# Patient Record
Sex: Female | Born: 1995 | Race: Black or African American | Hispanic: No | Marital: Single | Smoking: Never smoker
Health system: Southern US, Community
[De-identification: ages and names within clinical notes are randomized; demographics above are authoritative.]

## PROBLEM LIST (undated history)

## (undated) ENCOUNTER — Inpatient Hospital Stay (HOSPITAL_COMMUNITY): Payer: Self-pay

## (undated) DIAGNOSIS — F329 Major depressive disorder, single episode, unspecified: Secondary | ICD-10-CM

## (undated) DIAGNOSIS — F419 Anxiety disorder, unspecified: Secondary | ICD-10-CM

## (undated) DIAGNOSIS — F32A Depression, unspecified: Secondary | ICD-10-CM

## (undated) DIAGNOSIS — F4310 Post-traumatic stress disorder, unspecified: Secondary | ICD-10-CM

## (undated) HISTORY — PX: HERNIA REPAIR: SHX51

---
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
## 2017-05-07 LAB — CYTOLOGY - PAP: Pap: ABNORMAL — AB

## 2017-05-21 ENCOUNTER — Emergency Department (HOSPITAL_COMMUNITY)
Admission: EM | Admit: 2017-05-21 | Discharge: 2017-05-22 | Disposition: A | Discharge status: Other Acute Inpatient Hospital | Payer: Self-pay | Attending: Emergency Medicine | Admitting: Emergency Medicine

## 2017-05-21 ENCOUNTER — Encounter (HOSPITAL_COMMUNITY): Payer: Self-pay | Admitting: Nurse Practitioner

## 2017-05-21 DIAGNOSIS — T50902A Poisoning by unspecified drugs, medicaments and biological substances, intentional self-harm, initial encounter: Secondary | ICD-10-CM

## 2017-05-21 DIAGNOSIS — T391X2A Poisoning by 4-Aminophenol derivatives, intentional self-harm, initial encounter: Secondary | ICD-10-CM | POA: Insufficient documentation

## 2017-05-21 LAB — PROTIME-INR
INR: 1.11
Prothrombin Time: 14.3 seconds (ref 11.4–15.2)

## 2017-05-21 LAB — CBC
HCT: 37.7 % (ref 36.0–46.0)
Hemoglobin: 12.5 g/dL (ref 12.0–15.0)
MCH: 30.8 pg (ref 26.0–34.0)
MCHC: 33.2 g/dL (ref 30.0–36.0)
MCV: 92.9 fL (ref 78.0–100.0)
Platelets: 253 10*3/uL (ref 150–400)
RBC: 4.06 MIL/uL (ref 3.87–5.11)
RDW: 13.5 % (ref 11.5–15.5)
WBC: 7.2 10*3/uL (ref 4.0–10.5)

## 2017-05-21 LAB — COMPREHENSIVE METABOLIC PANEL
ALT: 13 U/L — ABNORMAL LOW (ref 14–54)
AST: 21 U/L (ref 15–41)
Albumin: 4 g/dL (ref 3.5–5.0)
Alkaline Phosphatase: 45 U/L (ref 38–126)
Anion gap: 9 (ref 5–15)
BUN: <5 mg/dL — ABNORMAL LOW (ref 6–20)
CO2: 21 mmol/L — ABNORMAL LOW (ref 22–32)
Calcium: 9.3 mg/dL (ref 8.9–10.3)
Chloride: 111 mmol/L (ref 101–111)
Creatinine, Ser: 0.64 mg/dL (ref 0.44–1.00)
GFR calc Af Amer: >60 mL/min (ref >60)
GFR calc non Af Amer: >60 mL/min (ref >60)
Glucose, Bld: 104 mg/dL — ABNORMAL HIGH (ref 65–99)
Potassium: 3.5 mmol/L (ref 3.5–5.1)
Sodium: 141 mmol/L (ref 135–145)
Total Bilirubin: 0.8 mg/dL (ref 0.3–1.2)
Total Protein: 7.4 g/dL (ref 6.5–8.1)

## 2017-05-21 LAB — SALICYLATE LEVEL: Salicylate Lvl: <7 mg/dL (ref 2.8–30.0)

## 2017-05-21 LAB — RAPID URINE DRUG SCREEN, HOSP PERFORMED
Amphetamines: NOT DETECTED
Barbiturates: NOT DETECTED
Benzodiazepines: NOT DETECTED
Cocaine: NOT DETECTED
Opiates: NOT DETECTED
Tetrahydrocannabinol: POSITIVE — AB

## 2017-05-21 LAB — I-STAT CG4 LACTIC ACID, ED: Lactic Acid, Venous: 1.64 mmol/L (ref 0.5–1.9)

## 2017-05-21 LAB — ETHANOL: Alcohol, Ethyl (B): <5 mg/dL (ref <5)

## 2017-05-21 LAB — ACETAMINOPHEN LEVEL
Acetaminophen (Tylenol), Serum: 87 ug/mL — ABNORMAL HIGH (ref 10–30)
Acetaminophen (Tylenol), Serum: 46 ug/mL — ABNORMAL HIGH (ref 10–30)

## 2017-05-21 MED ORDER — IBUPROFEN 400 MG PO TABS: 600.0000 mg | ORAL_TABLET | Freq: Three times a day (TID) | ORAL | Status: DC | PRN

## 2017-05-21 NOTE — ED Notes (Signed)
TTS in process 

## 2017-05-21 NOTE — ED Notes (Signed)
Pt given Malawiturkey sandwich, graham crackers, peanut butter, and ginger ale; dinner tray ordered

## 2017-05-21 NOTE — ED Notes (Signed)
Pt denies SI/HI/AVH, pt ate 90% of dinner that was brought from family.

## 2017-05-21 NOTE — ED Notes (Signed)
Pt belonging clothes, phone and wallet sent home with sister Lanier PrudeSharice.

## 2017-05-21 NOTE — ED Notes (Signed)
Per Poison control: labs, EKG, cardiac monitoring, 4 hour tylenol level and monitor for signs of liver failure.

## 2017-05-21 NOTE — ED Triage Notes (Signed)
Pt presents with c/o suicide attempt. She took approximately 12-14 extra strength tylenol in attempt to kill herself about two hours ago. She denies any pain, lethargy, weakness, nausea, vomiting at this time. She reports she's felt very depressed for the past several years but has never tried to hurt herself. She denies any thoughts of hurting others. She voluntarily came to emergency department with a family member today.
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
## 2017-05-21 NOTE — ED Provider Notes (Signed)
[facility]-EMERGENCY DEPT Provider Note   CSN: [id number] Arrival date & time: 05/21/17  1310     History   Chief Complaint Chief Complaint  Patient presents with  . Suicidal    HPI Kim Allen is a 21 y.o. female.  HPI  Patient presents after a suicide attempt. 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 She does acknowledge a history of depression, does not see a mental health professional this time. Today, just prior to ED arrival the patient took between 27 and 14 extra strength Tylenol tablets, 500 mg She described this to a friend, and was brought here for evaluation.  When she can voluntarily. She denies new pain, nausea, lightheadedness, syncope, other physical complaints   History reviewed. No pertinent past medical history.  There are no active problems to display for this patient.   Past Surgical History:  Procedure Laterality Date  . HERNIA REPAIR      OB History    No data available       Home Medications    Prior to Admission medications   Not on File    Family History History reviewed. No pertinent family history.  Social History Social History  Substance Use Topics  . Smoking status: Never Smoker  . Smokeless tobacco: Never Used  . Alcohol use Yes     Allergies   Patient has no known allergies.   Review of Systems Review of Systems  Constitutional:       Per HPI, otherwise negative  HENT:       Per HPI, otherwise negative  Respiratory:       Per HPI, otherwise negative  Cardiovascular:       Per HPI, otherwise negative  Gastrointestinal: Negative for vomiting.  Endocrine:       Negative aside from HPI  Genitourinary:       Neg aside from HPI   Musculoskeletal:       Per HPI, otherwise negative  Skin: Negative.   Neurological: Negative for syncope.  Psychiatric/Behavioral:       Per hpi     Physical Exam Updated Vital Signs BP 128/86   Pulse (!) 58   Temp 98.5 F (36.9 C) (Oral)   Resp 15    SpO2 100%   Physical Exam  Constitutional: She is oriented to person, place, and time. She appears well-developed and well-nourished. No distress.  HENT:  Head: Normocephalic and atraumatic.  Eyes: Conjunctivae and EOM are normal.  Cardiovascular: Normal rate and regular rhythm.   Pulmonary/Chest: Effort normal and breath sounds normal. No stridor. No respiratory distress.  Abdominal: She exhibits no distension.  Musculoskeletal: She exhibits no edema.  Neurological: She is alert and oriented to person, place, and time. No cranial nerve deficit.  Skin: Skin is warm and dry.  Psychiatric: She has a normal mood and affect. She expresses suicidal ideation. She expresses suicidal plans.  Nursing note and vitals reviewed.    ED Treatments / Results  Labs (all labs ordered are listed, but only abnormal results are displayed) Labs Reviewed  COMPREHENSIVE METABOLIC PANEL - Abnormal; Notable for the following:       Result Value   CO2 21 (*)    Glucose, Bld 104 (*)    BUN <5 (*)    ALT 13 (*)    All other components within normal limits  ACETAMINOPHEN LEVEL - Abnormal; Notable for the following:    Acetaminophen (Tylenol), Serum 87 (*)    All other components  within normal limits  RAPID URINE DRUG SCREEN, HOSP PERFORMED - Abnormal; Notable for the following:    Tetrahydrocannabinol POSITIVE (*)    All other components within normal limits  ACETAMINOPHEN LEVEL - Abnormal; Notable for the following:    Acetaminophen (Tylenol), Serum 46 (*)    All other components within normal limits  ETHANOL  SALICYLATE LEVEL  CBC  PROTIME-INR  I-STAT BETA HCG BLOOD, ED ([facility], [facility], [facility] ONLY)  I-STAT CG4 LACTIC ACID, ED  CBG MONITORING, ED    EKG  EKG Interpretation  Date/Time:  Friday May 21 2017 14:04:37 EDT Ventricular Rate:  60 PR Interval:  144 QRS Duration: 82 QT Interval:  388 QTC Calculation: 388 R Axis:   53 Text Interpretation:  Normal sinus rhythm Nonspecific ST abnormality  Abnormal ekg Confirmed by Gerhard MunchLockwood, Caili Escalera [phone]) on 05/21/2017 3:39:54 PM      Procedures Procedures (including critical care time)  Initial Impression / Assessment and Plan / ED Course  I have reviewed the triage vital signs and the nursing notes.  Pertinent labs & imaging results that were available during my care of the patient were reviewed by me and considered in my medical decision making (see chart for details).  After the initial evaluation we discussed the patient's case with her poison control colleagues. With concern for acetaminophen overdose, patient will have serial levels drawn.   Initial acetaminophen level 87 - less than four hours after ingestion.  Second acetaminophen level decreased, 46 - does not require NAC.  7:54 PM Patient awake, alert, eating. She has been medically cleared for psychiatric evaluation.   Final Clinical Impressions(s) / ED Diagnoses  Suicide attempt, intentional overdose    Gerhard MunchLockwood, Cedra Villalon, MD 05/21/17 2000

## 2017-05-21 NOTE — Progress Notes (Signed)
Spoke with poison control, Lori.  Gave information on APAP 4h level, LFTs, EKG information, and vitals.  No treatment needed at this time as level is falling and patient is asymptomatic.   Poison Control closing case.  Jordyn Doane D. Kensli Bowley, PharmD, BCPS Clinical Pharmacist [phone] 05/21/2017 5:25 PM

## 2017-05-21 NOTE — BH Assessment (Signed)
[facility]  Patient assigned to room 401-1 Accepting physician is Dr. Jama Flavorsobos  Call report to [phone] Spoke with nurse Jessica/Kevin Patient can come after 12am

## 2017-05-22 ENCOUNTER — Encounter (HOSPITAL_COMMUNITY): Payer: Self-pay

## 2017-05-22 ENCOUNTER — Inpatient Hospital Stay (HOSPITAL_COMMUNITY)
Admission: AD | Admit: 2017-05-22 | Discharge: 2017-05-25 | DRG: 885 | Disposition: A | Discharge status: Home / Self Care | Payer: No Typology Code available for payment source | Source: Intra-hospital | Attending: Psychiatry | Admitting: Psychiatry

## 2017-05-22 DIAGNOSIS — G4700 Insomnia, unspecified: Secondary | ICD-10-CM | POA: Diagnosis present

## 2017-05-22 DIAGNOSIS — T391X2A Poisoning by 4-Aminophenol derivatives, intentional self-harm, initial encounter: Secondary | ICD-10-CM | POA: Diagnosis present

## 2017-05-22 DIAGNOSIS — F322 Major depressive disorder, single episode, severe without psychotic features: Secondary | ICD-10-CM | POA: Diagnosis not present

## 2017-05-22 DIAGNOSIS — F1220 Cannabis dependence, uncomplicated: Secondary | ICD-10-CM | POA: Diagnosis present

## 2017-05-22 DIAGNOSIS — F419 Anxiety disorder, unspecified: Secondary | ICD-10-CM | POA: Diagnosis present

## 2017-05-22 DIAGNOSIS — F4310 Post-traumatic stress disorder, unspecified: Secondary | ICD-10-CM | POA: Diagnosis present

## 2017-05-22 MED ORDER — MAGNESIUM HYDROXIDE 400 MG/5ML PO SUSP: 15.0000 mL | Freq: Every day | ORAL | Status: DC | PRN

## 2017-05-22 MED ORDER — FLUOXETINE HCL 20 MG PO CAPS
20.0000 mg | ORAL_CAPSULE | Freq: Every day | ORAL | Status: DC
  Administered 2017-05-23 – 2017-05-25 (×3): 20 mg via ORAL
  Filled 2017-05-22 (×3): qty 1
  Filled 2017-05-22: qty 7
  Filled 2017-05-22: qty 1

## 2017-05-22 MED ORDER — ALUM & MAG HYDROXIDE-SIMETH 200-200-20 MG/5ML PO SUSP: 15.0000 mL | ORAL | Status: DC | PRN

## 2017-05-22 MED ORDER — HYDROXYZINE HCL 25 MG PO TABS
25.0000 mg | ORAL_TABLET | Freq: Three times a day (TID) | ORAL | Status: DC | PRN
  Administered 2017-05-22 – 2017-05-24 (×2): 25 mg via ORAL
  Filled 2017-05-22 (×2): qty 1
  Filled 2017-05-22: qty 10

## 2017-05-22 NOTE — [facility] Group Notes (Signed)
[facility] Group Notes: (Clinical Social Work)   05/22/2017      Type of Therapy:  Group Therapy   Participation Level:  Did Not Attend despite MHT prompting   Ambrose MantleMareida Grossman-Orr, LCSW 05/22/2017, 1:56 PM

## 2017-05-22 NOTE — Progress Notes (Addendum)
Patient attended group and said that her day was a 6.  Patient said that she had  learned for her group this morning  that sometime you have to cut a few of your family members off.

## 2017-05-22 NOTE — [facility] Group Notes (Signed)
Identifying Needs   Date:  05/22/2017  Time:  1300  Type of Therapy:  Nurse Education / Identifying Needs : The group focuses on teaching patients how to identify their needs and then how to develop skills needed to get them met.  Participation Level:  Active  Participation Quality:  Appropriate  Affect:  Appropriate  Cognitive:  Alert  Insight:  Good  Engagement in Group:  Engaged  Modes of Intervention:  Education  Summary of Progress/Problems:  Kim Allen 05/22/2017, 5:26 PM

## 2017-05-22 NOTE — Tx Team (Signed)
Initial Treatment Plan 05/22/2017 3:55 AM Kim LamerGladys Schaumburg RUE:[id number]    PATIENT STRESSORS: Educational concerns Financial difficulties Legal issue Marital or family conflict Substance abuse   PATIENT STRENGTHS: Ability for insight Active sense of humor Average or above average intelligence Motivation for treatment/growth   PATIENT IDENTIFIED PROBLEMS: SI  Depression  Anxiety  THC abuse      " I want to find a way to get support for my  depression. I have no one I can talk to."         DISCHARGE CRITERIA:  Adequate post-discharge living arrangements  PRELIMINARY DISCHARGE PLAN: Return to previous living arrangement  PATIENT/FAMILY INVOLVEMENT: This treatment plan has been presented to and reviewed with the patient, Kim LamerGladys Manske, and/or family member.  The patient and family have been given the opportunity to ask questions and make suggestions.  Floyce StakesGarrison, Shelaine Frie, RN 05/22/2017, 3:55 AM
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
## 2017-05-22 NOTE — Progress Notes (Signed)
Patient ID: Kim LamerGladys Jarvis, female   DOB: August 06, 1996, 21 y.o.   MRN: [id number]  Pt currently presents with a flat affect and guarded behavior. Pt reports "I have nothing to accomplish." Pt states concerns over her PTSD diagnosis as when she told her mother, she said "she was sad." Pt reports good sleep with current medication regimen. Pt requests to be started on an antidepressant. Pt wishes to be discharged soon.   Pt provided with medications per providers orders. Pt's labs and vitals were monitored throughout the night. Pt given a 1:1 about emotional and mental status. Pt supported and encouraged to express concerns and questions. Pt educated on medications and diagnosis. Offered materials, pt refused. Chart reviewed, provider notified of patients concerns.  Pt's safety ensured with 15 minute and environmental checks. Pt currently denies SI/HI and A/V hallucinations. Pt verbally agrees to seek staff if SI/HI or A/VH occurs and to consult with staff before acting on any harmful thoughts. Pt signs a 72 hour form with Clinical research associatewriter, informed of consent. Will continue POC.
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
## 2017-05-22 NOTE — Progress Notes (Signed)
D: Pt presents with depressed affect and mood on initial approach. Brief with staff on interactions. Per pt "yeah, I had a bad night and a bad day so I took the pills and tried to kill myself". "I don't plan on staying here long, I just need help with my depression". Pt reported that she has been depressed "all my life or you can say for a long time now". Rates her depression 7/10, hopelessness 4/10 and anxiety 2/10 when assessed. Pt's goal for today "looking into and getting help for my depression". Pt's keys were given to her cousin earlier this shift per pt's request to pick up her clothes from her apartment [facility]. Reports fair sleep, fair appetite, low energy and good concentration level on self inventory sheet. A: Emotional support and availability provided to pt. Encouraged pt to voice concerns and attend scheduled unit groups / activities. Q 15 minutes safety checks maintained without self harm gestures or outburst to note thus far. R: Pt  Attended scheduled unit groups. Went off unit for activities and meals with peers; returned without issues. Remains safe on and off unit. Denies concerns at this time. POC maintained for safety and mood stability.
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
## 2017-05-23 LAB — LIPID PANEL
Cholesterol: 162 mg/dL (ref 0–200)
HDL: 48 mg/dL (ref >40)
LDL Cholesterol: 99 mg/dL (ref 0–99)
Total CHOL/HDL Ratio: 3.4 RATIO
Triglycerides: 73 mg/dL (ref <150)
VLDL: 15 mg/dL (ref 0–40)

## 2017-05-23 LAB — TSH: TSH: 1.207 u[IU]/mL (ref 0.350–4.500)

## 2017-05-23 NOTE — Progress Notes (Signed)
[facility] MD Progress Note  05/23/2017 12:25 PM Kim Allen  MRN:  [id number] Subjective:  Yesterday was good. When do you think I can go home. It helped me talking to you yesterday a lot.   Per nursing: Pt currently presents with a flat affect and guarded behavior. Pt reports "I have nothing to accomplish." Pt states concerns over her PTSD diagnosis as when she told her mother, she said "she was sad." Pt reports good sleep with current medication regimen. Pt requests to be started on an antidepressant. Pt wishes to be discharged soon.   Objective: "Im grea after talking with you yesterday and opening up about how I felt. I think that was all I needed. I know you said this is the beginning and you want me to do some therapy.  I have learned more coping skills for anxiety and depression."  Patient seen by this NP today, case discussed with social worker and nursing. As per nurse no acute problem, tolerating medications without any side effect. No somatic complaints.  Patient evaluated and case reviewed 05/23/2017.  Pt is alert/oriented x4, calm and cooperative during the evaluation. During evaluation patient reported having a good day yesterday adjusting to the unit and, tolerating dose of medication well last night. She is started on Prozac 20mg  po daily for depression and anxiety. She denies suicidal/homicidal ideation, auditory/visual hallucination, anxiety, or depression/feeling sad. Denies any side effects from the medications at this time. She is able to tolerate breakfast and no GI symptoms. She endorses better night's sleep last night, good appetite, no acute pain.Reports she continues to attend and participate in group mileu reporting her goal for today is to, " work on triggers for anxiety and depression" Engaging well with peers. No suicidal ideation or self-harm, or psychosis. She is complaint with medications reporting they are well tolerated and denying any adverse events. Pt currently denies  SI/HI and A/V hallucinations. Pt verbally agrees to seek staff if SI/HI or A/VH occurs and to consult with staff before acting on any harmful thoughts.   Principal Problem: Severe major depression without psychotic features (HCC) Diagnosis:   Patient Active Problem List   Diagnosis Date Noted  . Severe major depression without psychotic features (HCC) [F32.2] 05/22/2017  . Cannabis use disorder, severe, dependence (HCC) [F12.20] 05/22/2017   Total Time spent with patient: 30 minutes  Past Psychiatric History: None  Past Medical History: History reviewed. No pertinent past medical history.  Past Surgical History:  Procedure Laterality Date  . HERNIA REPAIR     Family History:  Family History  Problem Relation Age of Onset  . Hypertension Mother   . Stroke Mother   . Hypertension Father    Family Psychiatric  History: No diagnosis, reports undiagnosed mental illness in family.  Social History:  History  Alcohol Use  . Yes    Comment: pt reports 1 drink in 4- 5 months     History  Drug Use  . Types: Marijuana    Comment: pt reports 2 times daily    Social History   Social History  . Marital status: Single    Spouse name: N/A  . Number of children: N/A  . Years of education: N/A   Social History Main Topics  . Smoking status: Never Smoker  . Smokeless tobacco: Never Used  . Alcohol use Yes     Comment: pt reports 1 drink in 4- 5 months  . Drug use: Yes    Types: Marijuana  Comment: pt reports 2 times daily  . Sexual activity: Yes    Birth control/ protection: Condom   Other Topics Concern  . None   Social History Narrative  . None   Additional Social History:     Sleep: Good  Appetite:  Fair  Current Medications: Current Facility-Administered Medications  Medication Dose Route Frequency Provider Last Rate Last Dose  . alum & mag hydroxide-simeth (MAALOX/MYLANTA) 200-200-20 MG/5ML suspension 15 mL  15 mL Oral Q4H PRN Nira ConnBerry, Jason A, NP      .  FLUoxetine (PROZAC) capsule 20 mg  20 mg Oral Daily Nira ConnBerry, Jason A, NP   20 mg at 05/23/17 0845  . hydrOXYzine (ATARAX/VISTARIL) tablet 25 mg  25 mg Oral TID PRN Nira ConnBerry, Jason A, NP   25 mg at 05/22/17 0325  . magnesium hydroxide (MILK OF MAGNESIA) suspension 15 mL  15 mL Oral Daily PRN Jackelyn PolingBerry, Jason A, NP        Lab Results:  Results for orders placed or performed during the hospital encounter of 05/22/17 (from the past 48 hour(s))  TSH     Status: None   Collection Time: 05/23/17  6:30 AM  Result Value Ref Range   TSH 1.207 0.350 - 4.500 uIU/mL    Comment: Performed by a 3rd Generation assay with a functional sensitivity of <=0.01 uIU/mL. Performed at [facility], [address] [place], [place] [id number]     Blood Alcohol level:  Lab Results  Component Value Date   ETH <5 05/21/2017    Metabolic Disorder Labs: No results found for: HGBA1C, MPG No results found for: PROLACTIN No results found for: CHOL, TRIG, HDL, CHOLHDL, VLDL, LDLCALC  Physical Findings: AIMS: Facial and Oral Movements Muscles of Facial Expression: None, normal Lips and Perioral Area: None, normal Jaw: None, normal Tongue: None, normal,Extremity Movements Upper (arms, wrists, hands, fingers): None, normal Lower (legs, knees, ankles, toes): None, normal, Trunk Movements Neck, shoulders, hips: None, normal, Overall Severity Severity of abnormal movements (highest score from questions above): None, normal Incapacitation due to abnormal movements: None, normal Patient's awareness of abnormal movements (rate only patient's report): No Awareness, Dental Status Current problems with teeth and/or dentures?: No Does patient usually wear dentures?: No  CIWA:    COWS:     Musculoskeletal: Strength & Muscle Tone: within normal limits Gait & Station: normal Patient leans: N/A  Psychiatric Specialty Exam: Physical Exam  ROS  Blood pressure 114/79, pulse (!) 110, temperature 98.4 F (36.9  C), resp. rate 17, height 5\' 7"  (1.702 m), weight 92.1 kg (203 lb).Body mass index is 31.79 kg/m.  General Appearance: Fairly Groomed  Eye Contact:  Fair  Speech:  Clear and Coherent and Normal Rate  Volume:  Normal  Mood:  Euthymic and Improved, smiling and brightens upon approach  Affect:  Depressed and Flat  Thought Process:  Coherent, Goal Directed, Linear and Descriptions of Associations: Intact  Orientation:  Full (Time, Place, and Person)  Thought Content:  Logical  Suicidal Thoughts:  No  Homicidal Thoughts:  No  Memory:  Immediate;   Fair Recent;   Fair Remote;   Fair  Judgement:  Fair  Insight:  Fair  Psychomotor Activity:  Increased  Concentration:  Concentration: Fair and Attention Span: Fair  Recall:  [employer]  Fund of Knowledge:  Fair  Language:  Fair  Akathisia:  No  Handed:  Right  AIMS (if indicated):     Assets:  Communication Skills Desire for Improvement Financial  Resources/Insurance Housing Intimacy Physical Health Resilience Social Support Transportation Vocational/Educational  ADL's:  Intact  Cognition:  WNL  Sleep:  Number of Hours: 6.75     Treatment Plan Summary: Daily contact with patient to assess and evaluate symptoms and progress in treatment and Medication management 1. Will maintain Q 15 minutes observation for safety. Estimated LOS: 5-7 days 2. Patient will participate in group, milieu, and family therapy. Psychotherapy: Social and Doctor, hospital, anti-bullying, learning based strategies, cognitive behavioral, and family object relations individuation separation intervention psychotherapies can be considered.  3. Depression- not improving will continue with PRozac. 4. Will continue to monitor patient's mood and behavior. 5. Social Work will schedule a Family meeting to obtain collateral information and discuss discharge and follow up plan. Discharge concerns will also be addressed: Safety, stabilization, and access to  medication. SHe has signed a 72 hour notice, after care has been started at this time. She is to follow up with [facility] psychology clinic for students, they also have a phramacy on campus for her to get refills.  Truman Hayward, FNP 05/23/2017, 12:25 PM

## 2017-05-23 NOTE — Progress Notes (Signed)
D Kim Allen is doing very well. She greets this Clinical research associatewriter  this morning. She makes good eye contact and then she smiles broadly. She says " I don't know what happened..I guess things just fell apart...". She takes her scheduled prozac per MD order. A She completes her daily assessment and on this she writes she deneis SI today and she rated her depression, hopelessness and anxeity " 2/1/0", respectively. She reports she is using this time...here in the hosp..Counseling given: Not Answered  get it  Together. R Safety in place.

## 2017-05-23 NOTE — Progress Notes (Signed)
D: Pt was in her room upon initial approach.  Pt presents with depressed affect and mood.  She reports her day was "good" and her goal was "working on my discharge plan."  Pt reports she is discharging tomorrow and she feels safe to do so.  She plans to "stay with my cousin for a couple days, go to therapy, start my new job, try to get out of my apartment lease."  Pt denies SI/HI, denies hallucinations, denies pain.  Pt has been visible in milieu interacting with peers and staff appropriately.  Pt attended evening group.    A: Introduced self to pt.  Actively listened to pt and offered support and encouragement. Q15 minute safety checks maintained.  R: Pt is safe on the unit.  Pt verbally contracts for safety.  She reports she will inform staff of needs and concerns.  Will continue to monitor and assess.

## 2017-05-23 NOTE — [facility] Group Notes (Signed)
[facility] Group Notes:  (Clinical Social Work)   05/23/2017    1:15-2:15PM  Summary of Progress/Problems:   The main focus of today's process group was to   1)  discuss the importance of adding supports  2)  define health supports versus unhealthy supports  4)  identify the patient's current healthy supports and  4)  plan what healthy supports to add.  An emphasis was placed on using counselor, doctor, therapy groups, 12-step groups, and problem-specific support groups to expand supports.    The patient expressed full comprehension of the concepts presented, and agreed that there is a need to add more supports.  The patient stated her current supports include her sister and cousin, and she plans to get even more support from her cousin, while also getting therapy.  She talked about the various areas in her life where she needs support, including just finding appropriate help for her father who drinks a great deal and has serious resulting medical issues, while also losing a number of family members in the last months.  Type of Therapy:  Process Group with Motivational Interviewing  Participation Level:  Active  Participation Quality:  Attentive and Sharing  Affect:  Appropriate  Cognitive:  Appropriate  Insight:  Developing/Improving  Engagement in Therapy:  Engaged  Modes of Intervention:   Education, Support and Processing  Ambrose MantleMareida Grossman-Orr, LCSW 05/23/2017    4:33 PM

## 2017-05-23 NOTE — Plan of Care (Signed)
Problem: Safety: Goal: Periods of time without injury will increase Outcome: Progressing Pt has not harmed self or others tonight.  She denies SI/HI and verbally contracts for safety.    

## 2017-05-24 DIAGNOSIS — F419 Anxiety disorder, unspecified: Secondary | ICD-10-CM

## 2017-05-24 LAB — HEMOGLOBIN A1C
Hgb A1c MFr Bld: 5.5 % (ref 4.8–5.6)
Mean Plasma Glucose: 111 mg/dL

## 2017-05-24 NOTE — [facility] Suicide Risk Assessment (Signed)
[facility] INPATIENT:  Family/Significant Other Suicide Prevention Education  Suicide Prevention Education:  Contact Attempts: Cousin Lexus Primitivo GauzeFletcher, [phone], (name of family member/significant other) has been identified by the patient as the family member/significant other with whom the patient will be residing, and identified as the person(s) who will aid the patient in the event of a mental health crisis.  With written consent from the patient, two attempts were made to provide suicide prevention education, prior to and/or following the patient's discharge.  We were unsuccessful in providing suicide prevention education.  A suicide education pamphlet was given to the patient to share with family/significant other.  Date and time of first attempt: 05/24/17 at 3:50 PM  Sallee LangeAnne C Takhia Spoon 05/24/2017, 3:52 PM

## 2017-05-24 NOTE — Progress Notes (Signed)
Recreation Therapy Notes  Date: 05/24/17 Time: 0930 Location: 300 Hall Group Room  Group Topic: Stress Management  Goal Area(s) Addresses:  Patient will verbalize importance of using healthy stress management.  Patient will identify positive emotions associated with healthy stress management.   Behavioral Response: Engaged  Intervention: Stress Management  Activity :  LRT introduced the stress management technique of meditation.  LRT played Allen meditation on gratitude to allow patients to engage in the meditation.  Patients were to follow along as the meditation played to fully participate in the activity.   Education:  Stress Management, Discharge Planning.   Education Outcome: Acknowledges edcuation/In group clarification offered/Needs additional education  Clinical Observations/Feedback: Pt attended group.   [name], LRT/CTRS         Kim Allen 05/24/2017 11:33 AM 

## 2017-05-24 NOTE — [facility] Group Notes (Signed)
[facility] Planning [facility] Note   05/24/2017 4:12 PM  Participation Quality: Appropriate   Mood/Affect:  Appropriate   Plan for Discharge/Comments:  Return home, [facility].  If not, she has "uncle who runs [facility]" so will seek care there.  Agreeable to [facility] manager for assessment and referral for therapy and medications management  Transportation Means: family/friends  Supports:  Supportive family and friends, "working on getting out of my apartment lease, looking forward to seeing my children."  Sallee LangeAnne C Cunningham
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
## 2017-05-24 NOTE — Progress Notes (Signed)
D: Patient presents with flat, blunted affect; her mood appears depressed.  Her goal today is to "discharge and follow through with my discharge plan."  Patient denies any depressive symptoms.  She denies any thoughts of self harm.  Patient states she is sleeping well. Her appetite is fair; her energy level is normal and her concentration is good.  She denies any thoughts of self harm.  Patient appears guarded with staff. She has signed a 72 hour discharge which is up tomorrow. A: Continue to monitor medication management and MD orders.  Safety checks completed every 15 minutes per protocol.  Offer support and encouragement as needed. R: Patient is receptive to staff; her behavior is appropriate.

## 2017-05-24 NOTE — Progress Notes (Signed)
Adult Psychoeducational Group Note  Date:  05/24/2017 Time:  9:12 PM  Group Topic/Focus:  Wrap-Up Group:   The focus of this group is to help patients review their daily goal of treatment and discuss progress on daily workbooks.  Participation Level:  Active  Participation Quality:  Appropriate  Affect:  Appropriate  Cognitive:  Alert  Insight: Appropriate  Engagement in Group:  Engaged  Modes of Intervention:  Discussion  Additional Comments:  Patient stated having an okay day. Patient's goal for today was to discharge and to have a positive attitude.   Deniece Rankin L Macoy Rodwell 05/24/2017, 9:12 PM

## 2017-05-24 NOTE — [facility] Suicide Risk Assessment (Signed)
[facility] INPATIENT:  Family/Significant Other Suicide Prevention Education  Suicide Prevention Education:  Contact Attempts: Kim MinorsCousin, Kim Allen, [phone], (name of family member/significant other) has been identified by the patient as the family member/significant other with whom the patient will be residing, and identified as the person(s) who will aid the patient in the event of a mental health crisis.  With written consent from the patient, two attempts were made to provide suicide prevention education, prior to and/or following the patient's discharge.  We were unsuccessful in providing suicide prevention education.  A suicide education pamphlet was given to the patient to share with family/significant other.  Date and time of second attempt: 05/24/17 at 5 PM - no answer, no VM, CSW could not leave message  Kim Allen 05/24/2017, 4:56 PM

## 2017-05-24 NOTE — [facility] Group Notes (Signed)
[facility] LCSW Group Therapy  05/24/2017 1:15pm  Type of Therapy:  Group Therapy vercoming Obstacles  Participation Level:  Active  Participation Quality:  Appropriate   Affect:  Appropriate  Cognitive:  Appropriate and Oriented  Insight:  Developing/Improving and Improving  Engagement in Therapy:  Improving  Modes of Intervention:  Discussion, Exploration, Problem-solving and Support  Description of Group:   In this group patients will be encouraged to explore what they see as obstacles to their own wellness and recovery. They will be guided to discuss their thoughts, feelings, and behaviors related to these obstacles. The group will process together ways to cope with barriers, with attention given to specific choices patients can make. Each patient will be challenged to identify changes they are motivated to make in order to overcome their obstacles. This group will be process-oriented, with patients participating in exploration of their own experiences as well as giving and receiving support and challenge from other group members.  Summary of Patient Progress: Pt participated actively in group discussion, offering suggestions to peers and receiving feedback appropriately.   Therapeutic Modalities:   Cognitive Behavioral Therapy Solution Focused Therapy Motivational Interviewing Relapse Prevention Therapy   Kim ShanksLauren Davida Falconi, LCSW 05/24/2017 4:19 PM

## 2017-05-24 NOTE — Tx Team (Signed)
Interdisciplinary Treatment and Diagnostic Plan Update  05/24/2017 Time of Session: 9:00 AM Kim Allen MRN: [id number]  Principal Diagnosis: Severe major depression without psychotic features (HCC)  Secondary Diagnoses: Principal Problem:   Severe major depression without psychotic features (HCC) Active Problems:   Cannabis use disorder, severe, dependence (HCC)   Current Medications:  Current Facility-Administered Medications  Medication Dose Route Frequency Provider Last Rate Last Dose  . alum & mag hydroxide-simeth (MAALOX/MYLANTA) 200-200-20 MG/5ML suspension 15 mL  15 mL Oral Q4H PRN Nira ConnBerry, Jason A, NP      . FLUoxetine (PROZAC) capsule 20 mg  20 mg Oral Daily Nira ConnBerry, Jason A, NP   20 mg at 05/24/17 [id number]  . hydrOXYzine (ATARAX/VISTARIL) tablet 25 mg  25 mg Oral TID PRN Jackelyn PolingBerry, Jason A, NP   25 mg at 05/22/17 0325  . magnesium hydroxide (MILK OF MAGNESIA) suspension 15 mL  15 mL Oral Daily PRN Nira ConnBerry, Jason A, NP       PTA Medications: No prescriptions prior to admission.    Patient Stressors: Web designerducational concerns Financial difficulties Legal issue Marital or family conflict Substance abuse  Patient Strengths: Ability for insight Active sense of humor Average or above average intelligence Motivation for treatment/growth  Treatment Modalities: Medication Management, Group therapy, Case management,  1 to 1 session with clinician, Psychoeducation, Recreational therapy.   Physician Treatment Plan for Primary Diagnosis: Severe major depression without psychotic features (HCC) Long Term Goal(s): Improvement in symptoms so as ready for discharge Improvement in symptoms so as ready for discharge   Short Term Goals: Ability to identify changes in lifestyle to reduce recurrence of condition will improve Ability to verbalize feelings will improve Ability to disclose and discuss suicidal ideas Ability to demonstrate self-control will improve Ability to identify and develop  effective coping behaviors will improve Ability to maintain clinical measurements within normal limits will improve Compliance with prescribed medications will improve Ability to identify triggers associated with substance abuse/mental health issues will improve  Medication Management: Evaluate patient's response, side effects, and tolerance of medication regimen.  Therapeutic Interventions: 1 to 1 sessions, Unit Group sessions and Medication administration.  Evaluation of Outcomes: Progressing  Physician Treatment Plan for Secondary Diagnosis: Principal Problem:   Severe major depression without psychotic features (HCC) Active Problems:   Cannabis use disorder, severe, dependence (HCC)  Long Term Goal(s): Improvement in symptoms so as ready for discharge Improvement in symptoms so as ready for discharge   Short Term Goals: Ability to identify changes in lifestyle to reduce recurrence of condition will improve Ability to verbalize feelings will improve Ability to disclose and discuss suicidal ideas Ability to demonstrate self-control will improve Ability to identify and develop effective coping behaviors will improve Ability to maintain clinical measurements within normal limits will improve Compliance with prescribed medications will improve Ability to identify triggers associated with substance abuse/mental health issues will improve     Medication Management: Evaluate patient's response, side effects, and tolerance of medication regimen.  Therapeutic Interventions: 1 to 1 sessions, Unit Group sessions and Medication administration.  Evaluation of Outcomes: Progressing   RN Treatment Plan for Primary Diagnosis: Severe major depression without psychotic features (HCC) Long Term Goal(s): Knowledge of disease and therapeutic regimen to maintain health will improve  Short Term Goals: Ability to verbalize feelings will improve, Ability to disclose and discuss suicidal ideas and  Ability to identify and develop effective coping behaviors will improve  Medication Management: RN will administer medications as ordered by provider, will assess and evaluate  patient's response and provide education to patient for prescribed medication. RN will report any adverse and/or side effects to prescribing provider.  Therapeutic Interventions: 1 on 1 counseling sessions, Psychoeducation, Medication administration, Evaluate responses to treatment, Monitor vital signs and CBGs as ordered, Perform/monitor CIWA, COWS, AIMS and Fall Risk screenings as ordered, Perform wound care treatments as ordered.  Evaluation of Outcomes: Progressing   LCSW Treatment Plan for Primary Diagnosis: Severe major depression without psychotic features (HCC) Long Term Goal(s): Safe transition to appropriate next level of care at discharge, Engage patient in therapeutic group addressing interpersonal concerns.  Short Term Goals: Engage patient in aftercare planning with referrals and resources, Increase ability to appropriately verbalize feelings, Increase emotional regulation and Increase skills for wellness and recovery  Therapeutic Interventions: Assess for all discharge needs, 1 to 1 time with Social worker, Explore available resources and support systems, Assess for adequacy in community support network, Educate family and significant other(s) on suicide prevention, Complete Psychosocial Assessment, Interpersonal group therapy.  Evaluation of Outcomes: Progressing   Progress in Treatment: Attending groups: Yes. Participating in groups: Yes. Taking medication as prescribed: Yes. Toleration medication: Yes. Family/Significant other contact made: No, will contact:  cousin Lexus Fletcher at patient request Patient understands diagnosis: Yes. Discussing patient identified problems/goals with staff: Yes. Medical problems stabilized or resolved: Yes. Denies suicidal/homicidal ideation: Yes.  Developing  coping skills adequate for community stressors Issues/concerns per patient self-inventory: No. Other: NA  New problem(s) identified: Yes, Describe:  needs aftercare, unsure [facility], CSW assessing for resources  New Short Term/Long Term Goal(s):  Aftercare, developing coping skills  Discharge Plan or Barriers: none at this time  Reason for Continuation of Hospitalization: Depression Medication stabilization Suicidal ideation  Estimated Length of Stay:  3 -5 days  Attendees: Patient: 05/24/2017 12:28 PM  Physician: Sallyanne Havers MD 05/24/2017 12:28 PM  Nursing: Daiva Huge RN 05/24/2017 12:28 PM  RN Care Manager: 05/24/2017 12:28 PM  Social Worker: Governor Rooks 05/24/2017 12:28 PM  Recreational Therapist:  05/24/2017 12:28 PM  Other: Alessandra Grout, NP; Tanika NP 05/24/2017 12:28 PM  Other:  05/24/2017 12:28 PM  Other: 05/24/2017 12:28 PM    Scribe for Treatment Team: Sallee Lange, LCSW 05/24/2017 12:28 PM

## 2017-05-24 NOTE — Progress Notes (Signed)
Patient attended group and said that her day was a 8. Her coping skills were deep breathing and J.ournaling.

## 2017-05-25 MED ORDER — FLUOXETINE HCL 20 MG PO CAPS: 20.0000 mg | ORAL_CAPSULE | Freq: Every day | ORAL | 0 refills | Status: DC

## 2017-05-25 MED ORDER — HYDROXYZINE HCL 25 MG PO TABS: 25.0000 mg | ORAL_TABLET | Freq: Three times a day (TID) | ORAL | 0 refills | Status: DC | PRN

## 2017-05-25 NOTE — Progress Notes (Signed)
D. Kim Allen had been up and visible in milieu this evening, did attend and participate in group activity. She spoke about having a good day today and spoke about how she is hopeful for discharge in the morning. She was able to receive bedtime medication without incident and did not verbalize any complaints of pain. A. Support and encouragement provided. R. Safety maintained, will continue to monitor.
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
## 2017-05-25 NOTE — Progress Notes (Signed)
  [facility] :  Will you be returning to the same living situation after discharge:  Yes,  pt returning home. At discharge, do you have transportation home?: Yes,  pt has access to transportation. Do you have the ability to pay for your medications: Yes,  samples and prescriptions provided.  Release of information consent forms completed and in the chart;  Patient's signature needed at discharge.  Patient to Follow up at: Follow-up Information    [facility] Follow up on 05/31/2017.   Why:  Appointment on June 11 at 3 PM for crisis management w [facility], medications management and therapy appointments will be schedule at this time after assessment.   Contact information: [facility]   [address]   [place]  [address] Phone:  [336] [phone] Fax:  [phone]          Next level of care provider has access to [facility] Link:no  Safety Planning and Suicide Prevention discussed: Yes,  with pt.  Have you used any form of tobacco in the last 30 days? (Cigarettes, Smokeless Tobacco, Cigars, and/or Pipes): No  Has patient been referred to the Quitline?: N/A patient is not a smoker  Patient has been referred for addiction treatment: Yes  Jonathon JordanLynn B Glady Ouderkirk, MSW, LCSWA 05/25/2017, 10:23 AM
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
## 2017-05-25 NOTE — BHH Group Notes (Signed)
The focus of this group is to educate the patient on the purpose and policies of crisis stabilization and provide a format to answer questions about their admission.  The group details unit policies and expectations of patients while admitted.  Patient dd not attend 0900 nurse education orientation group this morning.  Patient stayed in room.

## 2017-10-18 ENCOUNTER — Inpatient Hospital Stay (HOSPITAL_COMMUNITY): Payer: Medicaid Other

## 2017-10-18 ENCOUNTER — Encounter (HOSPITAL_COMMUNITY): Payer: Self-pay | Admitting: *Deleted

## 2017-10-18 ENCOUNTER — Inpatient Hospital Stay (HOSPITAL_COMMUNITY)
Admission: AD | Admit: 2017-10-18 | Discharge: 2017-10-19 | Disposition: A | Discharge status: Home / Self Care | Payer: Medicaid Other | Source: Ambulatory Visit | Attending: Obstetrics & Gynecology | Admitting: Obstetrics & Gynecology

## 2017-10-18 DIAGNOSIS — Z3201 Encounter for pregnancy test, result positive: Secondary | ICD-10-CM | POA: Insufficient documentation

## 2017-10-18 DIAGNOSIS — Z3A01 Less than 8 weeks gestation of pregnancy: Secondary | ICD-10-CM | POA: Diagnosis not present

## 2017-10-18 DIAGNOSIS — O99341 Other mental disorders complicating pregnancy, first trimester: Secondary | ICD-10-CM | POA: Diagnosis not present

## 2017-10-18 DIAGNOSIS — Z9889 Other specified postprocedural states: Secondary | ICD-10-CM | POA: Diagnosis not present

## 2017-10-18 DIAGNOSIS — O209 Hemorrhage in early pregnancy, unspecified: Secondary | ICD-10-CM | POA: Diagnosis not present

## 2017-10-18 DIAGNOSIS — N83201 Unspecified ovarian cyst, right side: Secondary | ICD-10-CM | POA: Diagnosis not present

## 2017-10-18 DIAGNOSIS — O3481 Maternal care for other abnormalities of pelvic organs, first trimester: Secondary | ICD-10-CM | POA: Insufficient documentation

## 2017-10-18 DIAGNOSIS — O26891 Other specified pregnancy related conditions, first trimester: Secondary | ICD-10-CM | POA: Diagnosis not present

## 2017-10-18 DIAGNOSIS — Z3491 Encounter for supervision of normal pregnancy, unspecified, first trimester: Secondary | ICD-10-CM

## 2017-10-18 DIAGNOSIS — K5900 Constipation, unspecified: Secondary | ICD-10-CM | POA: Diagnosis not present

## 2017-10-18 DIAGNOSIS — F329 Major depressive disorder, single episode, unspecified: Secondary | ICD-10-CM | POA: Insufficient documentation

## 2017-10-18 DIAGNOSIS — Z79899 Other long term (current) drug therapy: Secondary | ICD-10-CM | POA: Diagnosis not present

## 2017-10-18 DIAGNOSIS — O99611 Diseases of the digestive system complicating pregnancy, first trimester: Secondary | ICD-10-CM | POA: Insufficient documentation

## 2017-10-18 DIAGNOSIS — R109 Unspecified abdominal pain: Secondary | ICD-10-CM | POA: Insufficient documentation

## 2017-10-18 HISTORY — DX: Depression, unspecified: F32.A

## 2017-10-18 HISTORY — DX: Major depressive disorder, single episode, unspecified: F32.9

## 2017-10-18 LAB — HCG, QUANTITATIVE, PREGNANCY: hCG, Beta Chain, Quant, S: 77924 m[IU]/mL — ABNORMAL HIGH (ref <5)

## 2017-10-18 LAB — CBC
HCT: 32.6 % — ABNORMAL LOW (ref 36.0–46.0)
Hemoglobin: 11.5 g/dL — ABNORMAL LOW (ref 12.0–15.0)
MCH: 32.7 pg (ref 26.0–34.0)
MCHC: 35.3 g/dL (ref 30.0–36.0)
MCV: 92.6 fL (ref 78.0–100.0)
Platelets: 214 10*3/uL (ref 150–400)
RBC: 3.52 MIL/uL — ABNORMAL LOW (ref 3.87–5.11)
RDW: 13.3 % (ref 11.5–15.5)
WBC: 10.1 10*3/uL (ref 4.0–10.5)

## 2017-10-18 LAB — URINALYSIS, ROUTINE W REFLEX MICROSCOPIC
Bacteria, UA: NONE SEEN
Bilirubin Urine: NEGATIVE
Glucose, UA: NEGATIVE mg/dL
Ketones, ur: 5 mg/dL — AB
Leukocytes, UA: NEGATIVE
Nitrite: NEGATIVE
Protein, ur: NEGATIVE mg/dL
Specific Gravity, Urine: 1.023 (ref 1.005–1.030)
pH: 6 (ref 5.0–8.0)

## 2017-10-18 NOTE — MAU Note (Addendum)
Pt reports vaginal bleeding since this morning. Pt was seen at [facility] on 10/27 for abdominal and leg pain, but had no bleeding @ that time. Pt had an US and blood work.  Pt rates pain 9/10 currently. Took tylenol at home, with no relief. Pt reports that she had a small bowel movement yesterday, but is very constipated.

## 2017-10-19 DIAGNOSIS — O99611 Diseases of the digestive system complicating pregnancy, first trimester: Secondary | ICD-10-CM | POA: Diagnosis not present

## 2017-10-19 DIAGNOSIS — K5900 Constipation, unspecified: Secondary | ICD-10-CM

## 2017-10-19 LAB — WET PREP, GENITAL
Clue Cells Wet Prep HPF POC: NONE SEEN
Sperm: NONE SEEN
Trich, Wet Prep: NONE SEEN
Yeast Wet Prep HPF POC: NONE SEEN

## 2017-10-19 LAB — HIV ANTIBODY (ROUTINE TESTING W REFLEX): HIV Screen 4th Generation wRfx: NONREACTIVE

## 2017-10-19 LAB — ABO/RH: ABO/RH(D): O POS

## 2017-10-19 MED ORDER — PROMETHAZINE HCL 25 MG PO TABS: 12.5000 mg | ORAL_TABLET | Freq: Four times a day (QID) | ORAL | 2 refills | Status: DC | PRN

## 2017-10-19 NOTE — Discharge Instructions (Signed)
[facility]  ° ° °[facility]       Phone: [phone] ° °[facility] Phone: [phone] ° °[facility] [place]  Phone: [phone] ° °[facility] [place] [facility]  Phone: [phone] ° °[facility]  Phone: [phone] ° °[facility]       Phone: [phone] ° °[facility]    Phone: [phone]  ° °[facility]    Phone: [phone] ° °[facility]    Phone: [phone] ° °[facility]    Phone: [phone] ° °[facility]    Phone: [phone] ° °[facility]       Phone: [phone]  ° °[facility]  Phone: [phone] ° °[facility]    Phone: [phone] ° °[facility]    Phone: [phone] ° °[facility]      Phone: [phone] ° °[facility]    Phone: [phone] ° °

## 2017-10-19 NOTE — MAU Provider Note (Signed)
Chief Complaint: Vaginal Bleeding and Abdominal Pain   None     SUBJECTIVE HPI: Kim Allen is a 21 y.o. G1P0 at [redacted]w[redacted]d by LMP who presents to maternity admissions reporting vaginal bleeding starting today and abdominal pain x 2 weeks.  She reports the bleeding was bright red, started when wiping, but continued on a pad today.  She has not tried any treatments. The bleeding has reduced but not stopped since onset.  Her pain is in her left lower abdomen and is sharp, shooting, intermittent pain. She has not tried any treatments.  Nothing makes the pain better or worse. The pain is not associated with other symptoms but she does report recent nausea x 2 weeks. She tried Zofran x 1 dose as prescribed by another emergency room but has only taken one dose and it did not help.  She reports recent constipation, with bowel movements every 3-4 days that are hard and uncomfortable.   She denies vaginal itching/burning, urinary symptoms, h/a, dizziness, n/v, or fever/chills.     HPI  Past Medical History:  Diagnosis Date  . Depression    Past Surgical History:  Procedure Laterality Date  . HERNIA REPAIR     Social History   Social History  . Marital status: Single    Spouse name: N/A  . Number of children: N/A  . Years of education: N/A   Occupational History  . Not on file.   Social History Main Topics  . Smoking status: Never Smoker  . Smokeless tobacco: Never Used  . Alcohol use Yes     Comment: pt reports 1 drink in 4- 5 months  . Drug use: Yes    Types: Marijuana     Comment: pt reports 2 times daily  . Sexual activity: Yes    Birth control/ protection: Condom   Other Topics Concern  . Not on file   Social History Narrative  . No narrative on file   No current facility-administered medications on file prior to encounter.    Current Outpatient Prescriptions on File Prior to Encounter  Medication Sig Dispense Refill  . FLUoxetine (PROZAC) 20 MG capsule Take 1 capsule (20  mg total) by mouth daily. For depression 30 capsule 0  . hydrOXYzine (ATARAX/VISTARIL) 25 MG tablet Take 1 tablet (25 mg total) by mouth 3 (three) times daily as needed for anxiety. 60 tablet 0   No Known Allergies  ROS:  Review of Systems  Constitutional: Negative for chills, fatigue and fever.  Respiratory: Negative for shortness of breath.   Cardiovascular: Negative for chest pain.  Gastrointestinal: Positive for abdominal pain, constipation, nausea and vomiting.  Genitourinary: Positive for pelvic pain and vaginal bleeding. Negative for difficulty urinating, dysuria, flank pain, vaginal discharge and vaginal pain.  Neurological: Negative for dizziness and headaches.  Psychiatric/Behavioral: Negative.      I have reviewed patient's Past Medical Hx, Surgical Hx, Family Hx, Social Hx, medications and allergies.   Physical Exam  Patient Vitals for the past 24 hrs:  BP Temp Pulse Resp SpO2 Height Weight  10/19/17 0133 129/72 - - - - - -  10/18/17 2126 133/60 98.6 F (37 C) 70 18 - 5\' 7"  (1.702 m) 192 lb (87.1 kg)  10/18/17 2125 - - - - 100 % - -   Constitutional: Well-developed, well-nourished female in no acute distress.  Cardiovascular: normal rate Respiratory: normal effort GI: Abd soft, non-tender. Pos BS x 4 MS: Extremities nontender, no edema, normal ROM Neurologic: Alert and oriented  x 4.  GU: Neg CVAT.  PELVIC EXAM: Wet prep/GC collected by blind swab   LAB RESULTS Results for orders placed or performed during the hospital encounter of 10/18/17 (from the past 24 hour(s))  Urinalysis, Routine w reflex microscopic     Status: Abnormal   Collection Time: 10/18/17  9:48 PM  Result Value Ref Range   Color, Urine YELLOW YELLOW   APPearance CLEAR CLEAR   Specific Gravity, Urine 1.023 1.005 - 1.030   pH 6.0 5.0 - 8.0   Glucose, UA NEGATIVE NEGATIVE mg/dL   Hgb urine dipstick SMALL (A) NEGATIVE   Bilirubin Urine NEGATIVE NEGATIVE   Ketones, ur 5 (A) NEGATIVE mg/dL    Protein, ur NEGATIVE NEGATIVE mg/dL   Nitrite NEGATIVE NEGATIVE   Leukocytes, UA NEGATIVE NEGATIVE   RBC / HPF 0-5 0 - 5 RBC/hpf   WBC, UA 0-5 0 - 5 WBC/hpf   Bacteria, UA NONE SEEN NONE SEEN   Squamous Epithelial / LPF 0-5 (A) NONE SEEN   Mucus PRESENT   CBC     Status: Abnormal   Collection Time: 10/18/17  9:59 PM  Result Value Ref Range   WBC 10.1 4.0 - 10.5 K/uL   RBC 3.52 (L) 3.87 - 5.11 MIL/uL   Hemoglobin 11.5 (L) 12.0 - 15.0 g/dL   HCT 96.032.6 (L) 45.436.0 - 09.846.0 %   MCV 92.6 78.0 - 100.0 fL   MCH 32.7 26.0 - 34.0 pg   MCHC 35.3 30.0 - 36.0 g/dL   RDW 11.913.3 14.711.5 - 82.915.5 %   Platelets 214 150 - 400 K/uL  hCG, quantitative, pregnancy     Status: Abnormal   Collection Time: 10/18/17  9:59 PM  Result Value Ref Range   hCG, Beta Chain, Quant, S 77,924 (H) <5 mIU/mL  ABO/Rh     Status: None   Collection Time: 10/18/17  9:59 PM  Result Value Ref Range   ABO/RH(D) O POS   Wet prep, genital     Status: Abnormal   Collection Time: 10/19/17 12:46 AM  Result Value Ref Range   Yeast Wet Prep HPF POC NONE SEEN NONE SEEN   Trich, Wet Prep NONE SEEN NONE SEEN   Clue Cells Wet Prep HPF POC NONE SEEN NONE SEEN   WBC, Wet Prep HPF POC FEW (A) NONE SEEN   Sperm NONE SEEN     --/--/O POS (10/29 2159)  IMAGING [place] Ob Comp Less 14 Wks  Result Date: 10/18/2017 CLINICAL DATA:  [id number] year old pregnant female presenting with abdominal pain. LMP: 09/03/2017 corresponding to an estimated gestational age of [redacted] weeks, 3 days. EXAM: OBSTETRIC <14 WK US AND TRANSVAGINAL OB US TECHNIQUE: Both transabdominal and transvaginal ultrasound examinations were performed for complete evaluation of the gestation as well as the maternal uterus, adnexal regions, and pelvic cul-de-sac. Transvaginal technique was performed to assess early pregnancy. COMPARISON:  None. FINDINGS: Intrauterine gestational sac: Single intrauterine gestational sac. Yolk sac:  Seen Embryo:  Present Cardiac Activity: Detect Heart Rate: 119  bpm CRL:   8.3  mm   6 w   5 d                  US EDC: 06/08/2018 Subchorionic hemorrhage:  None visualized. Maternal uterus/adnexae: There is a 4 x 3.4 x 3.1 cm right ovarian cyst. The left ovary is unremarkable. IMPRESSION: Single live intrauterine pregnancy with an estimated gestational age of [redacted] weeks, 5 days. Electronically Signed   By: Ceasar MonsArash  Radparvar M.D.  On: 10/18/2017 23:39   US Ob Transvaginal  Result Date: 10/18/2017 CLINICAL DATA:  21 year old pregnant female presenting with abdominal pain. LMP: 09/03/2017 corresponding to an estimated gestational age of [redacted] weeks, 3 days. EXAM: OBSTETRIC <14 WK [place] AND TRANSVAGINAL OB US TECHNIQUE: Both transabdominal and transvaginal ultrasound examinations were performed for complete evaluation of the gestation as well as the maternal uterus, adnexal regions, and pelvic cul-de-sac. Transvaginal technique was performed to assess early pregnancy. COMPARISON:  None. FINDINGS: Intrauterine gestational sac: Single intrauterine gestational sac. Yolk sac:  Seen Embryo:  Present Cardiac Activity: Detect Heart Rate: 119  bpm CRL:  8.3  mm   6 w   5 d                  [place] EDC: 06/08/2018 Subchorionic hemorrhage:  None visualized. Maternal uterus/adnexae: There is a 4 x 3.4 x 3.1 cm right ovarian cyst. The left ovary is unremarkable. IMPRESSION: Single live intrauterine pregnancy with an estimated gestational age of [redacted] weeks, 5 days. Electronically Signed   By: Elgie Collard M.D.   On: 10/18/2017 23:39    MAU Management/MDM: Ordered labs and reviewed results.  IUP confirmed by today's [place] with no explanation for bleeding.  Wet prep wnl. GCC pending.  Bleeding may be from cervix or small [facility] not seen on [place].  Constipation is likely cause for pain.  Colace BID and Miralax daily.  Dietary changes to include high fiber and increased PO fluids.  Pt to f/u with early prenatal care, given contact information for OB/Gyn offices in [place].  Pt discharged with strict bleeding  precautions.  ASSESSMENT 1. Constipation during pregnancy in first trimester   2. Vaginal bleeding in pregnancy, first trimester   3. Abdominal pain during pregnancy in first trimester   4. Normal IUP (intrauterine pregnancy) on prenatal ultrasound, first trimester     PLAN Discharge home Allergies as of 10/19/2017   No Known Allergies     Medication List    STOP taking these medications   ondansetron 4 MG disintegrating tablet Commonly known as:  ZOFRAN-ODT     TAKE these medications   cephALEXin 500 MG capsule Commonly known as:  KEFLEX Take 500 mg by mouth 3 (three) times daily.   FLUoxetine 20 MG capsule Commonly known as:  PROZAC Take 1 capsule (20 mg total) by mouth daily. For depression   hydrOXYzine 25 MG tablet Commonly known as:  ATARAX/VISTARIL Take 1 tablet (25 mg total) by mouth 3 (three) times daily as needed for anxiety.   promethazine 25 MG tablet Commonly known as:  PHENERGAN Take 0.5-1 tablets (12.5-25 mg total) by mouth every 6 (six) hours as needed.      Follow-up Information    [facility].   Specialty:  Obstetrics and Gynecology Why:  Or prenatal provider of your choice, see list provided. Return to MAU as needed for emergencies. Contact information: [address] [place] [address] [phone]          Sharen Counter Certified Nurse-Midwife 10/19/2017  2:14 AM

## 2017-10-20 LAB — GC/CHLAMYDIA PROBE AMP (~~LOC~~) NOT AT ARMC
Chlamydia: NEGATIVE
Neisseria Gonorrhea: NEGATIVE

## 2017-11-29 ENCOUNTER — Telehealth: Payer: Self-pay | Admitting: *Deleted

## 2017-11-29 NOTE — Telephone Encounter (Signed)
Spoke with patient via phone.  Explained office wouldn't open until 10 am tomorrow.  R/s appt to 12/02/17 at 1240.  Patient agreeable.

## 2017-11-30 ENCOUNTER — Encounter: Payer: Self-pay | Admitting: Advanced Practice Midwife

## 2017-12-02 ENCOUNTER — Ambulatory Visit (INDEPENDENT_AMBULATORY_CARE_PROVIDER_SITE_OTHER): Payer: Medicaid Other | Admitting: Family Medicine

## 2017-12-02 ENCOUNTER — Encounter: Payer: Self-pay | Admitting: Family Medicine

## 2017-12-02 DIAGNOSIS — Z3401 Encounter for supervision of normal first pregnancy, first trimester: Secondary | ICD-10-CM | POA: Diagnosis present

## 2017-12-02 DIAGNOSIS — F322 Major depressive disorder, single episode, severe without psychotic features: Secondary | ICD-10-CM

## 2017-12-02 DIAGNOSIS — Z23 Encounter for immunization: Secondary | ICD-10-CM | POA: Diagnosis not present

## 2017-12-02 DIAGNOSIS — Z3400 Encounter for supervision of normal first pregnancy, unspecified trimester: Secondary | ICD-10-CM | POA: Insufficient documentation

## 2017-12-02 LAB — POCT URINALYSIS DIP (DEVICE)
Bilirubin Urine: NEGATIVE
Glucose, UA: NEGATIVE mg/dL
Ketones, ur: NEGATIVE mg/dL
Nitrite: NEGATIVE
Protein, ur: 30 mg/dL — AB
Specific Gravity, Urine: 1.02 (ref 1.005–1.030)
Urobilinogen, UA: 0.2 mg/dL (ref 0.0–1.0)
pH: 7.5 (ref 5.0–8.0)

## 2017-12-02 NOTE — Progress Notes (Signed)
Pt.has elevated GAB7. Pt.declined to see Jaimie.Dr. Shawnie PonsPratt aware. Nuchal Trans. Schedule for 12/08/17 @ 9:45am

## 2017-12-02 NOTE — Patient Instructions (Signed)
Breastfeeding Deciding to breastfeed is one of the best choices you can make for you and your baby. A change in hormones during pregnancy causes your breast tissue to grow and increases the number and size of your milk ducts. These hormones also allow proteins, sugars, and fats from your blood supply to make breast milk in your milk-producing glands. Hormones prevent breast milk from being released before your baby is born as well as prompt milk flow after birth. Once breastfeeding has begun, thoughts of your baby, as well as his or her sucking or crying, can stimulate the release of milk from your milk-producing glands. Benefits of breastfeeding For Your Baby  Your first milk (colostrum) helps your baby's digestive system function better.  There are antibodies in your milk that help your baby fight off infections.  Your baby has a lower incidence of asthma, allergies, and sudden infant death syndrome.  The nutrients in breast milk are better for your baby than infant formulas and are designed uniquely for your baby's needs.  Breast milk improves your baby's brain development.  Your baby is less likely to develop other conditions, such as childhood obesity, asthma, or type 2 diabetes mellitus.  For You  Breastfeeding helps to create a very special bond between you and your baby.  Breastfeeding is convenient. Breast milk is always available at the correct temperature and costs nothing.  Breastfeeding helps to burn calories and helps you lose the weight gained during pregnancy.  Breastfeeding makes your uterus contract to its prepregnancy size faster and slows bleeding (lochia) after you give birth.  Breastfeeding helps to lower your risk of developing type 2 diabetes mellitus, osteoporosis, and breast or ovarian cancer later in life.  Signs that your baby is hungry Early Signs of Hunger  Increased alertness or activity.  Stretching.  Movement of the head from side to  side.  Movement of the head and opening of the mouth when the corner of the mouth or cheek is stroked (rooting).  Increased sucking sounds, smacking lips, cooing, sighing, or squeaking.  Hand-to-mouth movements.  Increased sucking of fingers or hands.  Late Signs of Hunger  Fussing.  Intermittent crying.  Extreme Signs of Hunger Signs of extreme hunger will require calming and consoling before your baby will be able to breastfeed successfully. Do not wait for the following signs of extreme hunger to occur before you initiate breastfeeding:  Restlessness.  A loud, strong cry.  Screaming.  Breastfeeding basics Breastfeeding Initiation  Find a comfortable place to sit or lie down, with your neck and back well supported.  Place a pillow or rolled up blanket under your baby to bring him or her to the level of your breast (if you are seated). Nursing pillows are specially designed to help support your arms and your baby while you breastfeed.  Make sure that your baby's abdomen is facing your abdomen.  Gently massage your breast. With your fingertips, massage from your chest wall toward your nipple in a circular motion. This encourages milk flow. You may need to continue this action during the feeding if your milk flows slowly.  Support your breast with 4 fingers underneath and your thumb above your nipple. Make sure your fingers are well away from your nipple and your baby's mouth.  Stroke your baby's lips gently with your finger or nipple.  When your baby's mouth is open wide enough, quickly bring your baby to your breast, placing your entire nipple and as much of the colored area   around your nipple (areola) as possible into your baby's mouth. ? More areola should be visible above your baby's upper lip than below the lower lip. ? Your baby's tongue should be between his or her lower gum and your breast.  Ensure that your baby's mouth is correctly positioned around your nipple  (latched). Your baby's lips should create a seal on your breast and be turned out (everted).  It is common for your baby to suck about 2-3 minutes in order to start the flow of breast milk.  Latching Teaching your baby how to latch on to your breast properly is very important. An improper latch can cause nipple pain and decreased milk supply for you and poor weight gain in your baby. Also, if your baby is not latched onto your nipple properly, he or she may swallow some air during feeding. This can make your baby fussy. Burping your baby when you switch breasts during the feeding can help to get rid of the air. However, teaching your baby to latch on properly is still the best way to prevent fussiness from swallowing air while breastfeeding. Signs that your baby has successfully latched on to your nipple:  Silent tugging or silent sucking, without causing you pain.  Swallowing heard between every 3-4 sucks.  Muscle movement above and in front of his or her ears while sucking.  Signs that your baby has not successfully latched on to nipple:  Sucking sounds or smacking sounds from your baby while breastfeeding.  Nipple pain.  If you think your baby has not latched on correctly, slip your finger into the corner of your baby's mouth to break the suction and place it between your baby's gums. Attempt breastfeeding initiation again. Signs of Successful Breastfeeding Signs from your baby:  A gradual decrease in the number of sucks or complete cessation of sucking.  Falling asleep.  Relaxation of his or her body.  Retention of a small amount of milk in his or her mouth.  Letting go of your breast by himself or herself.  Signs from you:  Breasts that have increased in firmness, weight, and size 1-3 hours after feeding.  Breasts that are softer immediately after breastfeeding.  Increased milk volume, as well as a change in milk consistency and color by the fifth day of  breastfeeding.  Nipples that are not sore, cracked, or bleeding.  Signs That Your Baby is Getting Enough Milk  Wetting at least 1-2 diapers during the first 24 hours after birth.  Wetting at least 5-6 diapers every 24 hours for the first week after birth. The urine should be clear or pale yellow by 5 days after birth.  Wetting 6-8 diapers every 24 hours as your baby continues to grow and develop.  At least 3 stools in a 24-hour period by age 5 days. The stool should be soft and yellow.  At least 3 stools in a 24-hour period by age 7 days. The stool should be seedy and yellow.  No loss of weight greater than 10% of birth weight during the first 3 days of age.  Average weight gain of 4-7 ounces (113-198 g) per week after age 4 days.  Consistent daily weight gain by age 5 days, without weight loss after the age of 2 weeks.  After a feeding, your baby may spit up a small amount. This is common. Breastfeeding frequency and duration Frequent feeding will help you make more milk and can prevent sore nipples and breast engorgement. Breastfeed when   you feel the need to reduce the fullness of your breasts or when your baby shows signs of hunger. This is called "breastfeeding on demand." Avoid introducing a pacifier to your baby while you are working to establish breastfeeding (the first 4-6 weeks after your baby is born). After this time you may choose to use a pacifier. Research has shown that pacifier use during the first year of a baby's life decreases the risk of sudden infant death syndrome (SIDS). Allow your baby to feed on each breast as long as he or she wants. Breastfeed until your baby is finished feeding. When your baby unlatches or falls asleep while feeding from the first breast, offer the second breast. Because newborns are often sleepy in the first few weeks of life, you may need to awaken your baby to get him or her to feed. Breastfeeding times will vary from baby to baby. However,  the following rules can serve as a guide to help you ensure that your baby is properly fed:  Newborns (babies 4 weeks of age or younger) may breastfeed every 1-3 hours.  Newborns should not go longer than 3 hours during the day or 5 hours during the night without breastfeeding.  You should breastfeed your baby a minimum of 8 times in a 24-hour period until you begin to introduce solid foods to your baby at around 6 months of age.  Breast milk pumping Pumping and storing breast milk allows you to ensure that your baby is exclusively fed your breast milk, even at times when you are unable to breastfeed. This is especially important if you are going back to work while you are still breastfeeding or when you are not able to be present during feedings. Your lactation consultant can give you guidelines on how long it is safe to store breast milk. A breast pump is a machine that allows you to pump milk from your breast into a sterile bottle. The pumped breast milk can then be stored in a refrigerator or freezer. Some breast pumps are operated by hand, while others use electricity. Ask your lactation consultant which type will work best for you. Breast pumps can be purchased, but some hospitals and breastfeeding support groups lease breast pumps on a monthly basis. A lactation consultant can teach you how to hand express breast milk, if you prefer not to use a pump. Caring for your breasts while you breastfeed Nipples can become dry, cracked, and sore while breastfeeding. The following recommendations can help keep your breasts moisturized and healthy:  Avoid using soap on your nipples.  Wear a supportive bra. Although not required, special nursing bras and tank tops are designed to allow access to your breasts for breastfeeding without taking off your entire bra or top. Avoid wearing underwire-style bras or extremely tight bras.  Air dry your nipples for 3-4minutes after each feeding.  Use only cotton  bra pads to absorb leaked breast milk. Leaking of breast milk between feedings is normal.  Use lanolin on your nipples after breastfeeding. Lanolin helps to maintain your skin's normal moisture barrier. If you use pure lanolin, you do not need to wash it off before feeding your baby again. Pure lanolin is not toxic to your baby. You may also hand express a few drops of breast milk and gently massage that milk into your nipples and allow the milk to air dry.  In the first few weeks after giving birth, some women experience extremely full breasts (engorgement). Engorgement can make your   breasts feel heavy, warm, and tender to the touch. Engorgement peaks within 3-5 days after you give birth. The following recommendations can help ease engorgement:  Completely empty your breasts while breastfeeding or pumping. You may want to start by applying warm, moist heat (in the shower or with warm water-soaked hand towels) just before feeding or pumping. This increases circulation and helps the milk flow. If your baby does not completely empty your breasts while breastfeeding, pump any extra milk after he or she is finished.  Wear a snug bra (nursing or regular) or tank top for 1-2 days to signal your body to slightly decrease milk production.  Apply ice packs to your breasts, unless this is too uncomfortable for you.  Make sure that your baby is latched on and positioned properly while breastfeeding.  If engorgement persists after 48 hours of following these recommendations, contact your health care provider or a lactation consultant. Overall health care recommendations while breastfeeding  Eat healthy foods. Alternate between meals and snacks, eating 3 of each per day. Because what you eat affects your breast milk, some of the foods may make your baby more irritable than usual. Avoid eating these foods if you are sure that they are negatively affecting your baby.  Drink milk, fruit juice, and water to  satisfy your thirst (about 10 glasses a day).  Rest often, relax, and continue to take your prenatal vitamins to prevent fatigue, stress, and anemia.  Continue breast self-awareness checks.  Avoid chewing and smoking tobacco. Chemicals from cigarettes that pass into breast milk and exposure to secondhand smoke may harm your baby.  Avoid alcohol and drug use, including marijuana. Some medicines that may be harmful to your baby can pass through breast milk. It is important to ask your health care provider before taking any medicine, including all over-the-counter and prescription medicine as well as vitamin and herbal supplements. It is possible to become pregnant while breastfeeding. If birth control is desired, ask your health care provider about options that will be safe for your baby. Contact a health care provider if:  You feel like you want to stop breastfeeding or have become frustrated with breastfeeding.  You have painful breasts or nipples.  Your nipples are cracked or bleeding.  Your breasts are red, tender, or warm.  You have a swollen area on either breast.  You have a fever or chills.  You have nausea or vomiting.  You have drainage other than breast milk from your nipples.  Your breasts do not become full before feedings by the fifth day after you give birth.  You feel sad and depressed.  Your baby is too sleepy to eat well.  Your baby is having trouble sleeping.  Your baby is wetting less than 3 diapers in a 24-hour period.  Your baby has less than 3 stools in a 24-hour period.  Your baby's skin or the white part of his or her eyes becomes yellow.  Your baby is not gaining weight by 5 days of age. Get help right away if:  Your baby is overly tired (lethargic) and does not want to wake up and feed.  Your baby develops an unexplained fever. This information is not intended to replace advice given to you by your health care provider. Make sure you discuss  any questions you have with your health care provider. Document Released: 12/07/2005 Document Revised: 05/20/2016 Document Reviewed: 05/31/2013 Elsevier Interactive Patient Education  2017 [employer]  

## 2017-12-02 NOTE — Progress Notes (Signed)
   Subjective:    Kim Allen is a G1P0000 599w6d being seen today for her first obstetrical visit.  Her obstetrical history is not significant. Pregnancy history fully reviewed. Recent pap was WNL Neg GC/Chlam 10/18. Elevated GAD7, declined seeing Kim Allen.  Patient reports no complaints.  Vitals:   12/02/17 1258  BP: 115/61  Pulse: 74  Weight: 205 lb (93 kg)    HISTORY: OB History  Gravida Para Term Preterm AB Living  1 0 0 0 0 0  SAB TAB Ectopic Multiple Live Births  0 0 0 0 0    # Outcome Date GA Lbr Len/2nd Weight Sex Delivery Anes PTL Lv  1 Current              Past Medical History:  Diagnosis Date  . Depression    Past Surgical History:  Procedure Laterality Date  . HERNIA REPAIR     Family History  Problem Relation Age of Onset  . Hypertension Mother   . Stroke Mother   . Hypertension Father   . Diabetes Maternal Aunt   . Diabetes Paternal Aunt   . Diabetes Paternal Uncle      Exam    Uterus:     Pelvic Exam:    Skin: normal coloration and turgor, no rashes    Neurologic: oriented   Extremities: normal strength, tone, and muscle mass   HEENT extra ocular movement intact and sclera clear, anicteric   Mouth/Teeth mucous membranes moist, pharynx normal without lesions and dental hygiene good   Neck supple and no masses   Cardiovascular: regular rate and rhythm, no murmurs or gallops   Respiratory:  appears well, vitals normal, no respiratory distress, acyanotic, normal RR, ear and throat exam is normal, neck free of mass or lymphadenopathy, chest clear, no wheezing, crepitations, rhonchi, normal symmetric air entry   Abdomen: soft, non-tender; bowel sounds normal; no masses,  no organomegaly      Assessment/Plan:   1. Encounter for supervision of low-risk first pregnancy in first trimester For BabyScripts First screen ordered Anatomy ordered Flu shot given Problem list and box updated - Obstetric Panel, Including HIV - Hemoglobinopathy  Evaluation - Cystic fibrosis gene test - Culture, OB Urine - US MFM Fetal Nuchal Translucency; Future - Flu Vaccine QUAD 36+ mos IM (Fluarix, Quad PF) - Babyscripts Schedule Optimization - US MFM OB DETAIL +14 WK; Future  2. Major Depression Advised may need meds at end of pregnancy and that Kim Allen is here and available for any needs.  Kim Allen 12/02/2017

## 2017-12-04 LAB — CULTURE, OB URINE

## 2017-12-04 LAB — URINE CULTURE, OB REFLEX

## 2017-12-08 ENCOUNTER — Encounter (HOSPITAL_COMMUNITY): Payer: Self-pay

## 2017-12-08 ENCOUNTER — Ambulatory Visit (HOSPITAL_COMMUNITY)
Admission: RE | Admit: 2017-12-08 | Discharge: 2017-12-08 | Disposition: A | Discharge status: Home / Self Care | Payer: Medicaid Other | Source: Ambulatory Visit | Attending: Family Medicine | Admitting: Family Medicine

## 2017-12-08 DIAGNOSIS — Z3402 Encounter for supervision of normal first pregnancy, second trimester: Secondary | ICD-10-CM | POA: Diagnosis not present

## 2017-12-08 DIAGNOSIS — Z3401 Encounter for supervision of normal first pregnancy, first trimester: Secondary | ICD-10-CM | POA: Diagnosis present

## 2017-12-08 DIAGNOSIS — Z3682 Encounter for antenatal screening for nuchal translucency: Secondary | ICD-10-CM | POA: Diagnosis not present

## 2017-12-09 LAB — HEMOGLOBINOPATHY EVALUATION
Ferritin: 21 ng/mL (ref 15–150)
Hgb A2 Quant: 2.5 % (ref 1.8–3.2)
Hgb A: 97.5 % (ref 96.4–98.8)
Hgb C: 0 %
Hgb F Quant: 0 % (ref 0.0–2.0)
Hgb S: 0 %
Hgb Solubility: NEGATIVE
Hgb Variant: 0 %

## 2017-12-09 LAB — OBSTETRIC PANEL, INCLUDING HIV
Antibody Screen: NEGATIVE
Basophils Absolute: 0 10*3/uL (ref 0.0–0.2)
Basos: 0 %
EOS (ABSOLUTE): 0 10*3/uL (ref 0.0–0.4)
Eos: 0 %
HIV Screen 4th Generation wRfx: NONREACTIVE
Hematocrit: 34.1 % (ref 34.0–46.6)
Hemoglobin: 11.3 g/dL (ref 11.1–15.9)
Hepatitis B Surface Ag: NEGATIVE
Immature Grans (Abs): 0 10*3/uL (ref 0.0–0.1)
Immature Granulocytes: 0 %
Lymphocytes Absolute: 2 10*3/uL (ref 0.7–3.1)
Lymphs: 22 %
MCH: 31 pg (ref 26.6–33.0)
MCHC: 33.1 g/dL (ref 31.5–35.7)
MCV: 93 fL (ref 79–97)
Monocytes Absolute: 0.6 10*3/uL (ref 0.1–0.9)
Monocytes: 7 %
Neutrophils Absolute: 6.6 10*3/uL (ref 1.4–7.0)
Neutrophils: 71 %
Platelets: 216 10*3/uL (ref 150–379)
RBC: 3.65 x10E6/uL — ABNORMAL LOW (ref 3.77–5.28)
RDW: 14.4 % (ref 12.3–15.4)
RPR Ser Ql: NONREACTIVE
Rh Factor: POSITIVE
Rubella Antibodies, IGG: 11.1 index (ref >0.99)
WBC: 9.3 10*3/uL (ref 3.4–10.8)

## 2017-12-09 LAB — CYSTIC FIBROSIS GENE TEST

## 2017-12-10 ENCOUNTER — Other Ambulatory Visit: Payer: Self-pay | Admitting: Family Medicine

## 2017-12-10 DIAGNOSIS — Z3402 Encounter for supervision of normal first pregnancy, second trimester: Secondary | ICD-10-CM

## 2017-12-20 ENCOUNTER — Other Ambulatory Visit (HOSPITAL_COMMUNITY): Payer: Self-pay

## 2017-12-21 NOTE — L&D Delivery Note (Signed)
Delivery Note Kim LamerGladys Allen is a 22 y.o. G1P0000 at 5780w5d admitted for PROM.  Labor course: induced w/ cytotec, foley bulb, pitocin, arom of forebag At 0618 a viable female was delivered via spontaneous vaginal delivery (Presentation: ROA), anterior shoulder delivered w/o difficulty, but then very tight necklace nuchal cord (not looped around neck, but from umbilicus, then behind neck then anteriorally down front of body) not initially allowing for birth of abd/body-NICU called.  Guided infant head to maternal Lt side of body to ease tension of cord slightly as pt pushed, and delivered remainder of infant.  Infant placed directly on mom's abdomen for bonding/skin-to-skin. Initially non-vigorous baby w/ good hr dried/stimulated w/ good response- vigorous.  Pt requests delayed cord clamping until quits pulsing- this was about 5mins, then cord clamped x 2, and cut by FOB.  APGAR: ,9 ; weight: pending at time of note.  40 units of pitocin diluted in 1000cc LR was infused rapidly IV per protocol. The placenta separated spontaneously and delivered via CCT and maternal pushing effort.  It was inspected and appears to be intact with a 3 VC.  Placenta/Cord with the following complications: none .  Cord pH: not done Uterus initially firm, then became boggy w/ increased bleeding, expressed small amt clots and then cleared LUS. This happened again, so cytotec 1,000 pr given w/ good firming of uterus, LUS clear.   Intrapartum complications:  Intrapartum GHTN Anesthesia:  epidural Episiotomy: none Lacerations:  1st degree, Lt sulcal repaired for hemostasis Suture Repair: 3.0 vicryl Est. Blood Loss (mL): 500 Sponge and instrument count were correct x2.  Mom to postpartum.  Baby to Couplet care / Skin to Skin. Placenta to path (d/t maternal temp 100.4 immediately after delivery per RN). Plans to breastfeed Contraception: Nexplanon Circ: outpatient  Cheral MarkerKimberly R Janina Allen [name], [facility]NP-BC 06/08/2018 6:44 AM   Kim Allen,  Kim LaughterKimberly R, [name]  Kim Allen        Please schedule this patient for PP visit in: 1 week for BP check, then 4-6wks for pp visit  High risk pregnancy complicated by: GHTN intrapartum  Delivery mode: SVD  Anticipated Birth Control: Nexplanon  PP Procedures needed: BP check  Schedule Integrated BH visit: yes, depression on prozac  Provider: Any provider

## 2017-12-24 DIAGNOSIS — Z3402 Encounter for supervision of normal first pregnancy, second trimester: Secondary | ICD-10-CM

## 2017-12-27 ENCOUNTER — Other Ambulatory Visit: Payer: Self-pay

## 2017-12-27 NOTE — ED Notes (Signed)
Pt called for triage multiple times no answer 

## 2017-12-27 NOTE — ED Notes (Signed)
No answer for triage.

## 2017-12-28 ENCOUNTER — Emergency Department (HOSPITAL_COMMUNITY)
Admission: EM | Admit: 2017-12-28 | Discharge: 2017-12-28 | Discharge status: Against Medical Advice | Payer: Medicaid Other

## 2018-01-05 ENCOUNTER — Ambulatory Visit (INDEPENDENT_AMBULATORY_CARE_PROVIDER_SITE_OTHER): Payer: Self-pay | Admitting: Clinical

## 2018-01-05 ENCOUNTER — Ambulatory Visit (INDEPENDENT_AMBULATORY_CARE_PROVIDER_SITE_OTHER): Payer: Medicaid Other | Admitting: Medical

## 2018-01-05 ENCOUNTER — Encounter: Payer: Self-pay | Admitting: Medical

## 2018-01-05 VITALS — BP 123/63 | HR 58 | Wt 206.9 lb

## 2018-01-05 DIAGNOSIS — Z3402 Encounter for supervision of normal first pregnancy, second trimester: Secondary | ICD-10-CM

## 2018-01-05 DIAGNOSIS — N949 Unspecified condition associated with female genital organs and menstrual cycle: Secondary | ICD-10-CM

## 2018-01-05 DIAGNOSIS — F322 Major depressive disorder, single episode, severe without psychotic features: Secondary | ICD-10-CM

## 2018-01-05 DIAGNOSIS — Z3490 Encounter for supervision of normal pregnancy, unspecified, unspecified trimester: Secondary | ICD-10-CM

## 2018-01-05 DIAGNOSIS — F331 Major depressive disorder, recurrent, moderate: Secondary | ICD-10-CM

## 2018-01-05 MED ORDER — FLUOXETINE HCL 10 MG PO TABS: 10.0000 mg | ORAL_TABLET | Freq: Every day | ORAL | 1 refills | Status: DC

## 2018-01-05 MED ORDER — COMFORT FIT MATERNITY SUPP LG MISC: 1.0000 [IU] | Freq: Every day | 0 refills | Status: DC | PRN

## 2018-01-05 NOTE — Progress Notes (Signed)
   PRENATAL VISIT NOTE  Subjective:  Kim Allen is a 22 y.o. G1P0000 at 45108w5d being seen today for ongoing prenatal care.  She is currently monitored for the following issues for this high-risk pregnancy and has Severe major depression without psychotic features (HCC); Cannabis use disorder, severe, dependence (HCC); and Supervision of low-risk first pregnancy on their problem list.  Patient reports occasional lower abdominal pains, depression symptoms.  Contractions: Not present. Vag. Bleeding: None.  Movement: Absent. Denies leaking of fluid.   The following portions of the patient's history were reviewed and updated as appropriate: allergies, current medications, past family history, past medical history, past social history, past surgical history and problem list. Problem list updated.  Objective:   Vitals:   01/05/18 1516  BP: 123/63  Pulse: (!) 58  Weight: 206 lb 14.4 oz (93.8 kg)    Fetal Status: Fetal Heart Rate (bpm): 149   Movement: Absent     General:  Alert, oriented and cooperative. Patient is in no acute distress.  Skin: Skin is warm and dry. No rash noted.   Cardiovascular: Normal heart rate noted  Respiratory: Normal respiratory effort, no problems with respiration noted  Abdomen: Soft, gravid, appropriate for gestational age.  Pain/Pressure: Present     Pelvic: Cervical exam performed        Extremities: Normal range of motion.  Edema: None  Mental Status:  Normal mood and affect. Normal behavior. Normal judgment and thought content.   Assessment and Plan:  Pregnancy: G1P0000 at 57108w5d  1. Encounter for supervision of low-risk pregnancy, antepartum - AFP, Serum, Open Spina Bifida  2. Severe major depression without psychotic features (HCC) - Ambulatory referral to Integrated Behavioral Health  3. Round ligament pain - Elastic Bandages & Supports (COMFORT FIT MATERNITY SUPP LG) MISC; 1 Units by Does not apply route daily as needed.  Dispense: 1 each; Refill:  0  Preterm labor/second trimester warning symptoms and general obstetric precautions including but not limited to vaginal bleeding, contractions, leaking of fluid and fetal movement were reviewed in detail with the patient. Please refer to After Visit Summary for other counseling recommendations.  Return in about 8 weeks (around 03/02/2018) for LOB, 28 week labs (fasting), Babyscripts.   Vonzella NippleJulie Wenzel, PA-C

## 2018-01-05 NOTE — Progress Notes (Signed)
[facility]. It is recommended that she restart Prozac for her depression symptoms. She was on 20 mg daily previously, last taken a few months ago. Will plan to restart at 10mg  today and increase dose if needed. Rx [facility]'s pharmacy. [facility] on status of starting Rx on Friday.   Marny LowensteinWenzel, Doralee Kocak N, PA-C 01/05/2018 4:30 PM

## 2018-01-05 NOTE — Patient Instructions (Addendum)
Second Trimester of Pregnancy The second trimester is from week 13 through week 28, month 4 through 6. This is often the time in pregnancy that you feel your best. Often times, morning sickness has lessened or quit. You may have more energy, and you may get hungry more often. Your unborn baby (fetus) is growing rapidly. At the end of the sixth month, he or she is about 9 inches long and weighs about 1 pounds. You will likely feel the baby move (quickening) between 18 and 20 weeks of pregnancy. Follow these instructions at home:  Avoid all smoking, herbs, and alcohol. Avoid drugs not approved by your doctor.  Do not use any tobacco products, including cigarettes, chewing tobacco, and electronic cigarettes. If you need help quitting, ask your doctor. You may get counseling or other support to help you quit.  Only take medicine as told by your doctor. Some medicines are safe and some are not during pregnancy.  Exercise only as told by your doctor. Stop exercising if you start having cramps.  Eat regular, healthy meals.  Wear a good support bra if your breasts are tender.  Do not use hot tubs, steam rooms, or saunas.  Wear your seat belt when driving.  Avoid raw meat, uncooked cheese, and liter boxes and soil used by cats.  Take your prenatal vitamins.  Take 1500-2000 milligrams of calcium daily starting at the 20th week of pregnancy until you deliver your baby.  Try taking medicine that helps you poop (stool softener) as needed, and if your doctor approves. Eat more fiber by eating fresh fruit, vegetables, and whole grains. Drink enough fluids to keep your pee (urine) clear or pale yellow.  Take warm water baths (sitz baths) to soothe pain or discomfort caused by hemorrhoids. Use hemorrhoid cream if your doctor approves.  If you have puffy, bulging veins (varicose veins), wear support hose. Raise (elevate) your feet for 15 minutes, 3-4 times a day. Limit salt in your diet.  Avoid heavy  lifting, wear low heals, and sit up straight.  Rest with your legs raised if you have leg cramps or low back pain.  Visit your dentist if you have not gone during your pregnancy. Use a soft toothbrush to brush your teeth. Be gentle when you floss.  You can have sex (intercourse) unless your doctor tells you not to.  Go to your doctor visits. Get help if:  You feel dizzy.  You have mild cramps or pressure in your lower belly (abdomen).  You have a nagging pain in your belly area.  You continue to feel sick to your stomach (nauseous), throw up (vomit), or have watery poop (diarrhea).  You have bad smelling fluid coming from your vagina.  You have pain with peeing (urination). Get help right away if:  You have a fever.  You are leaking fluid from your vagina.  You have spotting or bleeding from your vagina.  You have severe belly cramping or pain.  You lose or gain weight rapidly.  You have trouble catching your breath and have chest pain.  You notice sudden or extreme puffiness (swelling) of your face, hands, ankles, feet, or legs.  You have not felt the baby move in over an hour.  You have severe headaches that do not go away with medicine.  You have vision changes. This information is not intended to replace advice given to you by your health care provider. Make sure you discuss any questions you have with your health care  provider. Document Released: 03/03/2010 Document Revised: 05/14/2016 Document Reviewed: 02/07/2013 Elsevier Interactive Patient Education  2017 [place] Education Options: [facility] Department Classes:  Childbirth education classes can help you get ready for a positive parenting experience. You can also meet other expectant parents and get free stuff for your baby. Each class runs for five weeks on the same night and costs $45 for the mother-to-be and her support person. Medicaid covers the cost if you are eligible.  Call [phone] to register. [facility] Childbirth Education:  [phone] or [phone] or [email or web address]  Baby & Me Class: Discuss newborn & infant parenting and family adjustment issues with other new mothers in a relaxed environment. Each week brings a new speaker or baby-centered activity. We encourage new mothers to join [place] every Thursday at 11:00am. Babies birth until crawling. No registration or fee. Daddy [employer]: This course offers Dads-to-be the tools and knowledge needed to feel confident on their journey to becoming new fathers. Experienced dads, who have been trained as coaches, teach dads-to-be how to hold, comfort, diaper, swaddle and play with their infant while being able to support the new mom as well. A class for men taught by men. $25/dad Big Brother/Big Sister: Let your children share in the joy of a new brother or sister in this special class designed just for them. Class includes discussion about how families care for babies: swaddling, holding, diapering, safety as well as how they can be helpful in their new role. This class is designed for children ages 67 to 18, but any age is welcome. Please register each child individually. $5/child  Mom Talk: This mom-led group offers support and connection to mothers as they journey through the adjustments and struggles of that sometimes overwhelming first year after the birth of a child. Tuesdays at 10:00am and Thursdays at 6:00pm. Babies welcome. No registration or fee. Breastfeeding Support Group: This group is a mother-to-mother support circle where moms have the opportunity to share their breastfeeding experiences. A Lactation Consultant is present for questions and concerns. Meets each Tuesday at 11:00am. No fee or registration. Breastfeeding Your Baby: Learn what to expect in the first days of breastfeeding your newborn.  This class will help you feel more confident with the skills needed to begin your  breastfeeding experience. Many new mothers are concerned about breastfeeding after leaving the hospital. This class will also address the most common fears and challenges about breastfeeding during the first few weeks, months and beyond. (call for fee) Comfort Techniques and Tour: This 2 hour interactive class will provide you the opportunity to learn & practice hands-on techniques that can help relieve some of the discomfort of labor and encourage your baby to rotate toward the best position for birth. You and your partner will be able to try a variety of labor positions with birth balls and rebozos as well as practice breathing, relaxation, and visualization techniques. A tour of the [facility] is included with this class. $20 per registrant and support person Childbirth Class- Weekend Option: This class is a Weekend version of our Birth & Baby series. It is designed for parents who have a difficult time fitting several weeks of classes into their schedule. It covers the care of your newborn and the basics of labor and childbirth. It also includes a [facility] and lunch. The class is held two consecutive days: beginning on Friday evening from 6:30 - 8:30 p.m. and the  the next day, Saturday from 9 a.m. - 4 p.m. (call for fee) Waterbirth Class: Interested in a waterbirth?  This informational class will help you discover whether waterbirth is the right fit for you. Education about waterbirth itself, supplies you would need and how to assemble your support team is what you can expect from this class. Some obstetrical practices require this class in order to pursue a waterbirth. (Not all obstetrical practices offer waterbirth-check with your healthcare provider.) Register only the expectant mom, but you are encouraged to bring your partner to class! Required if planning waterbirth, no fee. Infant/Child CPR: Parents, grandparents, babysitters, and friends  learn Cardio-Pulmonary Resuscitation skills for infants and children. You will also learn how to treat both conscious and unconscious choking in infants and children. This Family & Friends program does not offer certification. Register each participant individually to ensure that enough mannequins are available. (Call for fee) Grandparent Love: Expecting a grandbaby? This class is for you! Learn about the latest infant care and safety recommendations and ways to support your own child as he or she transitions into the parenting role. Taught by Registered Nurses who are childbirth instructors, but most importantly...they are grandmothers too! $10/person. Childbirth Class- Natural Childbirth: This series of 5 weekly classes is for expectant parents who want to learn and practice natural methods of coping with the process of labor and childbirth. Relaxation, breathing, massage, visualization, role of the partner, and helpful positioning are highlighted. Participants learn how to be confident in their body's ability to give birth. This class will empower and help parents make informed decisions about their own care. Includes discussion that will help new parents transition into the immediate postpartum period. Maternity Care Center Tour of [facility] is included. We suggest taking this class between 25-32 weeks, but it's only a recommendation. $75 per registrant and one support person or $30 Medicaid. Childbirth Class- 3 week Series: This option of 3 weekly classes helps you and your labor partner prepare for childbirth. Newborn care, labor & birth, cesarean birth, pain management, and comfort techniques are discussed and a Maternity Care Center Tour of [facility] is included. The class meets at the same time, on the same day of the week for 3 consecutive weeks beginning with the starting date you choose. $60 for registrant and one support person.  Marvelous Multiples: Expecting twins, triplets, or more?  This class covers the differences in labor, birth, parenting, and breastfeeding issues that face multiples' parents. NICU tour is included. Led by a Certified Childbirth Educator who is the mother of twins. No fee. Caring for Baby: This class is for expectant and adoptive parents who want to learn and practice the most up-to-date newborn care for their babies. Focus is on birth through the first six weeks of life. Topics include feeding, bathing, diapering, crying, umbilical cord care, circumcision care and safe sleep. Parents learn to recognize symptoms of illness and when to call the pediatrician. Register only the mom-to-be and your partner or support person can plan to come with you! $10 per registrant and support person Childbirth Class- online option: This online class offers you the freedom to complete a Birth and Baby series in the comfort of your own home. The flexibility of this option allows you to review sections at your own pace, at times convenient to you and your support people. It includes additional video information, animations, quizzes, and extended activities. Get organized with helpful eClass tools, checklists, and trackers. Once you register online for the   you will receive an email within a few days to accept the invitation and begin the class when the time is right for you. The content will be available to you for 60 days. $60 for 60 days of online access for you and your support people.  Local Doulas: Natural Baby Doulas [email or web address] Tel: [phone] [email or web address] [employer] [phone] [email or web address] The Labor Hassell Halim  (also do waterbirth tub rental) [phone] [email or web address] [facility] Birth Doula ([phone] [email or web address] NotebookDistributors.fi Sacred Rhythms  [phone] https://[email or web address]-[email or web address]/ [employer]  [employer] (PADA) [email or web address] [email or web address] [place] [facility] Baby  http://[email or web address]/ Considering Waterbirth? Guide for patients at [facility] [employer]  Why consider waterbirth?  . Gentle birth for babies . Less pain medicine used in labor . May allow for passive descent/less pushing . May reduce perineal tears  . More mobility and instinctive maternal position changes . Increased maternal relaxation . Reduced blood pressure in labor  Is waterbirth safe? What are the risks of infection, drowning or other complications?  . Infection: o Very low risk (3.7 % for tub vs 4.8% for bed) o 7 in 8000 waterbirths with documented infection o Poorly cleaned equipment most common cause o Slightly lower group B strep transmission rate  . Drowning o Maternal:  - Very low risk   - Related to seizures or fainting o Newborn:  - Very low risk. No evidence of increased risk of respiratory problems in multiple large studies - Physiological protection from breathing under water - Avoid underwater birth if there are any fetal complications - Once baby's head is out of the water, keep it out.  . Birth complication o Some reports of cord trauma, but risk decreased by bringing baby to surface gradually o No evidence of increased risk of shoulder dystocia. Mothers can usually change positions faster in water than in a bed, possibly aiding the maneuvers to free the shoulder.   You must attend a Doren Custard class at [facility]  3rd Wednesday of every month from 7-9pm  [employer] by calling [phone] or online at [email or web address]  Bring [place] the certificate from the class to your prenatal appointment  Meet with a midwife at 36 weeks to see if you can still plan a waterbirth and to sign the consent.   Purchase or rent the following supplies:   Water Birth Pool (Birth Pool in a Box or [place] for instance)  (Tubs  start ~$125)  Single-use disposable tub liner designed for your brand of tub  New garden hose labeled "lead-free", "suitable for drinking water",  Electric drain pump to remove water (We recommend 792 gallon per hour or greater pump.)   Separate garden hose to remove the dirty water  Fish net  Bathing suit top (optional)  Long-handled mirror (optional)  Places to purchase or rent supplies  GotWebTools.is for tub purchases and supplies  Waterbirthsolutions.com for tub purchases and supplies  The Labor Ladies (www.[email or web address]) $275 for tub rental/set-up & take down/kit   [employer] (http://www.fleming.com/.htm) Information regarding doulas (labor support) who provide pool rentals  Our practice has a Birth Pool in a Box tub at the hospital that you may borrow on a first-come-first-served basis. It is your responsibility to to set up, clean and break down the tub. We cannot guarantee the availability of this tub in advance. You are responsible for bringing all accessories listed above. If you do not have all necessary supplies you cannot have a waterbirth.    Things that would prevent you from  from having a waterbirth:  Premature, <37wks  Previous cesarean birth  Presence of thick meconium-stained fluid  Multiple gestation (Twins, triplets, etc.)  Uncontrolled diabetes or gestational diabetes requiring medication  Hypertension requiring medication or diagnosis of pre-eclampsia  Heavy vaginal bleeding  Non-reassuring fetal heart rate  Active infection (MRSA, etc.). Group B Strep is NOT a contraindication for  waterbirth.  If your labor has to be induced and induction method requires continuous  monitoring of the baby's heart rate  Other risks/issues identified by your obstetrical provider  Please remember that birth is unpredictable. Under certain unforeseeable circumstances your provider may advise against giving birth in the tub. These  decisions will be made on a case-by-case basis and with the safety of you and your baby as our highest priority.      

## 2018-01-05 NOTE — BH Specialist Note (Signed)
Integrated Behavioral Health Initial Visit  MRN: [id number] Name: Kim Allen  Number of Integrated Behavioral Health Clinician visits:: 1/6 Session Start time: 4:00  Session End time: 4:27 Total time: 30 minutes  Type of Service: Integrated Behavioral Health- Individual/Family Interpretor:No. Interpretor Name and Language: n/a   Warm Hand Off Completed.       SUBJECTIVE: Kim LamerGladys Ethier is a 22 y.o. female accompanied by n/a Patient was referred by Vonzella NippleJulie Wenzel, PA-C for depression. Patient reports the following symptoms/concerns: Pt states her primary concern today is the need to be back on her Prozac, as she is starting to feel depressed after being off meds for about 2 months. Pt says that "nothing else helps" with her symptoms of depression; open to reading educational materials at home. Duration of problem: Increase current pregnancy; Severity of problem: moderately severe  OBJECTIVE: Mood: Depressed and Affect: Depressed Risk of harm to self or others: Suicidal ideation No plan to harm self or others; Denies current SI, but admits to SI in the past  LIFE CONTEXT: Family and Social: Stays away from family School/Work: Fulltime work Self-Care: Self-aware in recognizing a greater need for self-care; taking action towards her goals Life Changes: Current pregnancy, and changing work responsibilities  GOALS ADDRESSED: Patient will: 1. Reduce symptoms of: depression 2. Increase knowledge and/or ability of: self-management skills  3. Demonstrate ability to: Increase healthy adjustment to current life circumstances  INTERVENTIONS: Interventions utilized: Solution-Focused Strategies and Psychoeducation and/or Health Education  Standardized Assessments completed: GAD-7 and PHQ 9  ASSESSMENT: Patient currently experiencing Major depressive disorder, recurrent, moderate   Patient may benefit from psychoeducation and brief therapeutic interventions regarding coping with  symptoms of depression .  PLAN: 1. Follow up with behavioral health clinician on : [facility];  At next medical appointment for office visit (or earlier, if requested by patient) 2. Behavioral recommendations:  -Pick up BH medications, from pharmacy; take as prescribed by medical provider -Read educational materials regarding coping with symptoms of depression  3. Referral(s): [facility] 4. "From scale of 1-10, how likely are you to follow plan?": 10  Rae LipsJamie C McMannes, LCSW   Depression screen [facility] 01/05/2018 12/02/2017  Decreased Interest 1 1  Down, Depressed, Hopeless 2 2  PHQ - 2 Score 3 3  Altered sleeping 1 1  Tired, decreased energy 1 2  Change in appetite 1 0  Feeling bad or failure about yourself  3 2  Trouble concentrating 1 0  Moving slowly or fidgety/restless 0 0  Suicidal thoughts 1 0  PHQ-9 Score 11 8   GAD 7 : Generalized Anxiety Score 01/05/2018 12/02/2017  Nervous, Anxious, on Edge 2 0  Control/stop worrying 3 3  Worry too much - different things 3 3  Trouble relaxing 1 3  Restless 0 0  Easily annoyed or irritable 3 3  Afraid - awful might happen 0 1  Total GAD 7 Score 12 13

## 2018-01-05 NOTE — Addendum Note (Signed)
Addended by: Marny LowensteinWENZEL, JULIE N on: 01/05/2018 04:30 PM   Modules accepted: Orders

## 2018-01-06 ENCOUNTER — Encounter (HOSPITAL_COMMUNITY): Payer: Self-pay | Admitting: Family Medicine

## 2018-01-07 ENCOUNTER — Telehealth: Payer: Self-pay | Admitting: Clinical

## 2018-01-07 NOTE — Telephone Encounter (Signed)
Integrated Behavioral Health Medication Management Phone Note  MRN: [id number] NAME: Darene LamerGladys Gibby  Time Call Initiated: 10:59 Time Call Completed: 11:03 Total Call Time: 3 minutes  Current Medications:  Outpatient Medications Prior to Visit  Medication Sig Dispense Refill  . Elastic Bandages & Supports (COMFORT FIT MATERNITY SUPP LG) MISC 1 Units by Does not apply route daily as needed. 1 each 0  . FLUoxetine (PROZAC) 10 MG tablet Take 1 tablet (10 mg total) by mouth daily. 30 tablet 1  . Prenatal Vit-Fe Fumarate-FA (MULTIVITAMIN-PRENATAL) 27-0.8 MG TABS tablet Take 1 tablet by mouth daily at 12 noon.     No facility-administered medications prior to visit.     Patient has been able to get all medications filled as prescribed: Yes  Patient is currently taking all medications as prescribed: Yes  Patient reports experiencing side effects: No  Patient describes feeling this way on medications: "nothing in particular"  Additional patient concerns: no  Patient advised to schedule appointment with provider for evaluation of medication side effects or additional concerns: Yes- [facility] appointment on 01/14/18   Rae LipsJamie C McMannes, LCSW

## 2018-01-08 LAB — AFP, SERUM, OPEN SPINA BIFIDA
AFP MoM: 1.17
AFP Value: 38.9 ng/mL
Gest. Age on Collection Date: 17 weeks
Maternal Age At EDD: 23 yr
OSBR Risk 1 IN: 10000
Test Results:: NEGATIVE
Weight: 207 [lb_av]

## 2018-01-14 ENCOUNTER — Ambulatory Visit (HOSPITAL_COMMUNITY)
Admission: RE | Admit: 2018-01-14 | Discharge: 2018-01-14 | Disposition: A | Discharge status: Home / Self Care | Payer: Medicaid Other | Source: Ambulatory Visit | Attending: Family Medicine | Admitting: Family Medicine

## 2018-01-14 DIAGNOSIS — Z3A19 19 weeks gestation of pregnancy: Secondary | ICD-10-CM | POA: Insufficient documentation

## 2018-01-14 DIAGNOSIS — Z3401 Encounter for supervision of normal first pregnancy, first trimester: Secondary | ICD-10-CM

## 2018-01-14 DIAGNOSIS — O99212 Obesity complicating pregnancy, second trimester: Secondary | ICD-10-CM | POA: Diagnosis present

## 2018-01-14 DIAGNOSIS — Z3689 Encounter for other specified antenatal screening: Secondary | ICD-10-CM | POA: Insufficient documentation

## 2018-02-11 ENCOUNTER — Telehealth: Payer: Self-pay | Admitting: *Deleted

## 2018-02-11 NOTE — Telephone Encounter (Signed)
Called patient back and she stated that she feels pain under her belly and on the sides. Advised that this is likely round ligament pain and recommended that she try using a belly band and stay hydrated. Patient voiced understanding and will hold off on an appt for now. She will call us back if the remedies discussed are not helpful. Pt reports no bleeding or leaking of fluid. She hasn't really started feeling a lot of fetal movement yet, feels some flutters from time to time.

## 2018-02-11 NOTE — Telephone Encounter (Signed)
Patient called and stated that she is 5 months pregnant and has been having some pains in her abdomen. Would like a call back to see if she needs to come in sooner.

## 2018-03-02 ENCOUNTER — Ambulatory Visit (INDEPENDENT_AMBULATORY_CARE_PROVIDER_SITE_OTHER): Payer: Medicaid Other | Admitting: Medical

## 2018-03-02 ENCOUNTER — Other Ambulatory Visit (HOSPITAL_COMMUNITY)
Admission: RE | Admit: 2018-03-02 | Discharge: 2018-03-02 | Disposition: A | Discharge status: Home / Self Care | Payer: Medicaid Other | Source: Ambulatory Visit | Attending: Family Medicine | Admitting: Family Medicine

## 2018-03-02 ENCOUNTER — Other Ambulatory Visit: Payer: Medicaid Other

## 2018-03-02 ENCOUNTER — Ambulatory Visit (INDEPENDENT_AMBULATORY_CARE_PROVIDER_SITE_OTHER): Payer: Self-pay | Admitting: Clinical

## 2018-03-02 ENCOUNTER — Encounter: Payer: Self-pay | Admitting: Medical

## 2018-03-02 VITALS — BP 115/75 | HR 101 | Wt 226.5 lb

## 2018-03-02 DIAGNOSIS — Z3400 Encounter for supervision of normal first pregnancy, unspecified trimester: Secondary | ICD-10-CM | POA: Diagnosis present

## 2018-03-02 DIAGNOSIS — F322 Major depressive disorder, single episode, severe without psychotic features: Secondary | ICD-10-CM

## 2018-03-02 DIAGNOSIS — Z3402 Encounter for supervision of normal first pregnancy, second trimester: Secondary | ICD-10-CM | POA: Diagnosis not present

## 2018-03-02 DIAGNOSIS — F331 Major depressive disorder, recurrent, moderate: Secondary | ICD-10-CM

## 2018-03-02 DIAGNOSIS — Z202 Contact with and (suspected) exposure to infections with a predominantly sexual mode of transmission: Secondary | ICD-10-CM

## 2018-03-02 NOTE — Progress Notes (Signed)
   PRENATAL VISIT NOTE  Subjective:  Kim Allen is a 22 y.o. G1P0000 at 4750w5d being seen today for ongoing prenatal care.  She is currently monitored for the following issues for this low-risk pregnancy and has Severe major depression without psychotic features (HCC); Cannabis use disorder, severe, dependence (HCC); and Supervision of low-risk first pregnancy on their problem list.  Patient reports concern for possible STD exposure.  Contractions: Not present. Vag. Bleeding: None.  Movement: Present. Denies leaking of fluid.   The following portions of the patient's history were reviewed and updated as appropriate: allergies, current medications, past family history, past medical history, past social history, past surgical history and problem list. Problem list updated.  Objective:   Vitals:   03/02/18 0828  BP: 115/75  Pulse: (!) 101  Weight: 226 lb 8 oz (102.7 kg)    Fetal Status: Fetal Heart Rate (bpm): 139 Fundal Height: 26 cm Movement: Present     General:  Alert, oriented and cooperative. Patient is in no acute distress.  Skin: Skin is warm and dry. No rash noted.   Cardiovascular: Normal heart rate noted  Respiratory: Normal respiratory effort, no problems with respiration noted  Abdomen: Soft, gravid, appropriate for gestational age.  Pain/Pressure: Absent     Pelvic: Cervical exam deferred        Extremities: Normal range of motion.  Edema: None  Mental Status:  Normal mood and affect. Normal behavior. Normal judgment and thought content.   Assessment and Plan:  Pregnancy: G1P0000 at 2850w5d  1. Encounter for supervision of low-risk first pregnancy, antepartum  2. Severe major depression without psychotic features (HCC) - Amb ref to Integrated Behavioral Health  3. Possible exposure to STD - Cervicovaginal ancillary only - RPR - HIV antibody - Hepatitis B surface antigen - Hepatitis C antibody  Preterm labor symptoms and general obstetric precautions including  but not limited to vaginal bleeding, contractions, leaking of fluid and fetal movement were reviewed in detail with the patient. Please refer to After Visit Summary for other counseling recommendations.  Return in about 3 weeks (around 03/23/2018) for LOB, 28 week labs (fasting).   Vonzella NippleJulie Kellen Hover, PA-C

## 2018-03-02 NOTE — BH Specialist Note (Signed)
Integrated Behavioral Health Follow Up Visit  MRN: [id number] Name: Kim Allen  Number of Integrated Behavioral Health Clinician visits: 2/6 Session Start time: 9:10  Session End time: 9:30 Total time: 20 minutes  Type of Service: Integrated Behavioral Health- Individual/Family Interpretor:No. Interpretor Name and Language: n/a  SUBJECTIVE: Kim Allen is a 22 y.o. female accompanied by n/a Patient was referred by Vonzella NippleJulie Wenzel, PA-C for depression and anxiety. Patient reports the following symptoms/concerns: Pt states her primary concern today is an increase in stress, worry, and depression, that she attributes to relationship with FOB,  financial stress, and changing work schedule affecting sleep. Pt says she is using meditation apps to cope.  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  Reduce symptoms of: anxiety, depression and stress  2.  Increase knowledge and/or ability of: stress reduction  3.  Demonstrate ability to: Increase healthy adjustment to current life circumstances  INTERVENTIONS: Interventions utilized:  Sleep Hygiene and Psychoeducation and/or Health Education Standardized Assessments completed: GAD-7 and PHQ 9  ASSESSMENT: Patient currently experiencing Major depressive disorder, recurrent, moderate  Patient may benefit from continued brief therapeutic interventions regarding coping with symptoms of anxiety and depression.  PLAN: 1. Follow up with behavioral health clinician on : one  month 2. Behavioral recommendations:  -Continue using meditation apps daily -Consider going outside in the morning for 15-20 minutes prior to work -Take home Postpartum Planner and use as a planning guide 3. Referral(s): Integrated Behavioral Health Services (In Clinic) 4. "From scale of 1-10, how likely are you to follow plan?": 9  Rae LipsJamie C McMannes, LCSW  Depression screen [facility] 2/9 03/02/2018 01/05/2018 12/02/2017  Decreased Interest 2 1 1   Down, Depressed, Hopeless 2 2 2   PHQ - 2 Score 4 3 3   Altered sleeping 1 1 1   Tired, decreased energy 3 1 2   Change in appetite 1 1 0  Feeling bad or failure about yourself  2 3 2   Trouble concentrating 2 1 0  Moving slowly or fidgety/restless 0 0 0  Suicidal thoughts 0 1 0  PHQ-9 Score 13 11 8    GAD 7 : Generalized Anxiety Score 03/02/2018 01/05/2018 12/02/2017  Nervous, Anxious, on Edge 2 2 0  Control/stop worrying 3 3 3   Worry too much - different things 3 3 3   Trouble relaxing 1 1 3   Restless 0 0 0  Easily annoyed or irritable 3 3 3   Afraid - awful might happen 2 0 1  Total GAD 7 Score 14 12 13

## 2018-03-02 NOTE — Patient Instructions (Signed)
Second Trimester of Pregnancy The second trimester is from week 13 through week 28, month 4 through 6. This is often the time in pregnancy that you feel your best. Often times, morning sickness has lessened or quit. You may have more energy, and you may get hungry more often. Your unborn baby (fetus) is growing rapidly. At the end of the sixth month, he or she is about 9 inches long and weighs about 1 pounds. You will likely feel the baby move (quickening) between 18 and 20 weeks of pregnancy. Follow these instructions at home:  Avoid all smoking, herbs, and alcohol. Avoid drugs not approved by your doctor.  Do not use any tobacco products, including cigarettes, chewing tobacco, and electronic cigarettes. If you need help quitting, ask your doctor. You may get counseling or other support to help you quit.  Only take medicine as told by your doctor. Some medicines are safe and some are not during pregnancy.  Exercise only as told by your doctor. Stop exercising if you start having cramps.  Eat regular, healthy meals.  Wear a good support bra if your breasts are tender.  Do not use hot tubs, steam rooms, or saunas.  Wear your seat belt when driving.  Avoid raw meat, uncooked cheese, and liter boxes and soil used by cats.  Take your prenatal vitamins.  Take 1500-2000 milligrams of calcium daily starting at the 20th week of pregnancy until you deliver your baby.  Try taking medicine that helps you poop (stool softener) as needed, and if your doctor approves. Eat more fiber by eating fresh fruit, vegetables, and whole grains. Drink enough fluids to keep your pee (urine) clear or pale yellow.  Take warm water baths (sitz baths) to soothe pain or discomfort caused by hemorrhoids. Use hemorrhoid cream if your doctor approves.  If you have puffy, bulging veins (varicose veins), wear support hose. Raise (elevate) your feet for 15 minutes, 3-4 times a day. Limit salt in your diet.  Avoid heavy  lifting, wear low heals, and sit up straight.  Rest with your legs raised if you have leg cramps or low back pain.  Visit your dentist if you have not gone during your pregnancy. Use a soft toothbrush to brush your teeth. Be gentle when you floss.  You can have sex (intercourse) unless your doctor tells you not to.  Go to your doctor visits. Get help if:  You feel dizzy.  You have mild cramps or pressure in your lower belly (abdomen).  You have a nagging pain in your belly area.  You continue to feel sick to your stomach (nauseous), throw up (vomit), or have watery poop (diarrhea).  You have bad smelling fluid coming from your vagina.  You have pain with peeing (urination). Get help right away if:  You have a fever.  You are leaking fluid from your vagina.  You have spotting or bleeding from your vagina.  You have severe belly cramping or pain.  You lose or gain weight rapidly.  You have trouble catching your breath and have chest pain.  You notice sudden or extreme puffiness (swelling) of your face, hands, ankles, feet, or legs.  You have not felt the baby move in over an hour.  You have severe headaches that do not go away with medicine.  You have vision changes. This information is not intended to replace advice given to you by your health care provider. Make sure you discuss any questions you have with your health care   provider. Document Released: 03/03/2010 Document Revised: 05/14/2016 Document Reviewed: 02/07/2013 Elsevier Interactive Patient Education  2017 Elsevier Inc.  

## 2018-03-02 NOTE — Progress Notes (Signed)
Discussed weight gain is above recommended. Patient and/or legal guardian verbally consented to meet with Behavioral Health Clinician about elevated phq9.  Request std testing due to possible exposure.

## 2018-03-03 LAB — HIV ANTIBODY (ROUTINE TESTING W REFLEX): HIV Screen 4th Generation wRfx: NONREACTIVE

## 2018-03-03 LAB — CERVICOVAGINAL ANCILLARY ONLY
Chlamydia: NEGATIVE
Neisseria Gonorrhea: NEGATIVE
Trichomonas: NEGATIVE

## 2018-03-03 LAB — HEPATITIS B SURFACE ANTIGEN: Hepatitis B Surface Ag: NEGATIVE

## 2018-03-03 LAB — RPR: RPR Ser Ql: NONREACTIVE

## 2018-03-03 LAB — HEPATITIS C ANTIBODY: Hep C Virus Ab: <0.1 s/co ratio (ref 0.0–0.9)

## 2018-03-09 ENCOUNTER — Telehealth: Payer: Self-pay | Admitting: *Deleted

## 2018-03-09 NOTE — Telephone Encounter (Signed)
Patient called, having pain in upper stomach area for the last 20-30 min, has not felt this before and would like a call back.

## 2018-03-10 NOTE — Telephone Encounter (Signed)
Patient called and states that the stomach pains have resolved. Patient states she thinks it was round ligament pain. Kim StammerJennifer Fanny Agan RNBSN

## 2018-03-28 ENCOUNTER — Encounter: Payer: Self-pay | Admitting: Advanced Practice Midwife

## 2018-03-28 ENCOUNTER — Other Ambulatory Visit: Payer: Medicaid Other

## 2018-03-28 ENCOUNTER — Ambulatory Visit (INDEPENDENT_AMBULATORY_CARE_PROVIDER_SITE_OTHER): Payer: Medicaid Other | Admitting: Advanced Practice Midwife

## 2018-03-28 VITALS — BP 121/68 | HR 77 | Wt 240.8 lb

## 2018-03-28 DIAGNOSIS — Z3403 Encounter for supervision of normal first pregnancy, third trimester: Secondary | ICD-10-CM | POA: Diagnosis present

## 2018-03-28 DIAGNOSIS — Z3400 Encounter for supervision of normal first pregnancy, unspecified trimester: Secondary | ICD-10-CM

## 2018-03-28 DIAGNOSIS — Z23 Encounter for immunization: Secondary | ICD-10-CM

## 2018-03-28 DIAGNOSIS — Z3490 Encounter for supervision of normal pregnancy, unspecified, unspecified trimester: Secondary | ICD-10-CM

## 2018-03-28 NOTE — Progress Notes (Signed)
   PRENATAL VISIT NOTE  Subjective:  Kim Allen is a 22 y.o. G1P0000 at 1482w3d being seen today for ongoing prenatal care.  She is currently monitored for the following issues for this low-risk pregnancy and has Severe major depression without psychotic features (HCC); Cannabis use disorder, severe, dependence (HCC); and Supervision of low-risk first pregnancy on their problem list.  Patient reports no complaints.  Contractions: Not present. Vag. Bleeding: None.  Movement: Present. Denies leaking of fluid.   The following portions of the patient's history were reviewed and updated as appropriate: allergies, current medications, past family history, past medical history, past social history, past surgical history and problem list. Problem list updated.  Objective:   Vitals:   03/28/18 1052  BP: 121/68  Pulse: 77  Weight: 240 lb 12.8 oz (109.2 kg)    Fetal Status: Fetal Heart Rate (bpm): 151 Fundal Height: 30 cm Movement: Present     General:  Alert, oriented and cooperative. Patient is in no acute distress.  Skin: Skin is warm and dry. No rash noted.   Cardiovascular: Normal heart rate noted  Respiratory: Normal respiratory effort, no problems with respiration noted  Abdomen: Soft, gravid, appropriate for gestational age.  Pain/Pressure: Absent     Pelvic: Cervical exam deferred        Extremities: Normal range of motion.  Edema: None  Mental Status: Normal mood and affect. Normal behavior. Normal judgment and thought content.   Assessment and Plan:  Pregnancy: G1P0000 at 5982w3d  1. Encounter for supervision of low-risk first pregnancy, antepartum  - Tdap vaccine greater than or equal to 7yo IM - 2 hour GTT today   Preterm labor symptoms and general obstetric precautions including but not limited to vaginal bleeding, contractions, leaking of fluid and fetal movement were reviewed in detail with the patient. Please refer to After Visit Summary for other counseling  recommendations.  Return in about 1 month (around 04/25/2018). Baby scripts  No future appointments.  Thressa ShellerHeather Kaylenn Civil, [name]

## 2018-03-28 NOTE — Patient Instructions (Signed)

## 2018-03-29 LAB — CBC
Hematocrit: 29.4 % — ABNORMAL LOW (ref 34.0–46.6)
Hemoglobin: 9.9 g/dL — ABNORMAL LOW (ref 11.1–15.9)
MCH: 31.3 pg (ref 26.6–33.0)
MCHC: 33.7 g/dL (ref 31.5–35.7)
MCV: 93 fL (ref 79–97)
Platelets: 248 10*3/uL (ref 150–379)
RBC: 3.16 x10E6/uL — ABNORMAL LOW (ref 3.77–5.28)
RDW: 14.1 % (ref 12.3–15.4)
WBC: 12.3 10*3/uL — ABNORMAL HIGH (ref 3.4–10.8)

## 2018-03-29 LAB — GLUCOSE TOLERANCE, 2 HOURS W/ 1HR
Glucose, 1 hour: 110 mg/dL (ref 65–179)
Glucose, 2 hour: 70 mg/dL (ref 65–152)
Glucose, Fasting: 84 mg/dL (ref 65–91)

## 2018-03-29 LAB — HIV ANTIBODY (ROUTINE TESTING W REFLEX): HIV Screen 4th Generation wRfx: NONREACTIVE

## 2018-03-29 LAB — RPR: RPR Ser Ql: NONREACTIVE

## 2018-04-10 ENCOUNTER — Other Ambulatory Visit: Payer: Self-pay | Admitting: Medical

## 2018-04-10 DIAGNOSIS — F322 Major depressive disorder, single episode, severe without psychotic features: Secondary | ICD-10-CM

## 2018-04-25 ENCOUNTER — Ambulatory Visit (INDEPENDENT_AMBULATORY_CARE_PROVIDER_SITE_OTHER): Payer: Medicaid Other | Admitting: Advanced Practice Midwife

## 2018-04-25 ENCOUNTER — Encounter: Payer: Self-pay | Admitting: Advanced Practice Midwife

## 2018-04-25 VITALS — BP 129/76 | HR 99 | Wt 248.8 lb

## 2018-04-25 DIAGNOSIS — Z3400 Encounter for supervision of normal first pregnancy, unspecified trimester: Secondary | ICD-10-CM

## 2018-04-25 NOTE — Progress Notes (Signed)
   PRENATAL VISIT NOTE  Subjective:  Kim Allen is a 22 y.o. G1P0000 at [redacted]w[redacted]d being seen today for ongoing prenatal care.  She is currently monitored for the following issues for this low-risk pregnancy and has Severe major depression without psychotic features (HCC); Cannabis use disorder, severe, dependence (HCC); and Supervision of low-risk first pregnancy on their problem list.  Patient reports no complaints.  Contractions: Not present. Vag. Bleeding: None.  Movement: Present. Denies leaking of fluid.   The following portions of the patient's history were reviewed and updated as appropriate: allergies, current medications, past family history, past medical history, past social history, past surgical history and problem list. Problem list updated.  Objective:   Vitals:   04/25/18 1027  BP: 129/76  Pulse: 99  Weight: 248 lb 12.8 oz (112.9 kg)    Fetal Status: Fetal Heart Rate (bpm): 149 Fundal Height: 34 cm Movement: Present     General:  Alert, oriented and cooperative. Patient is in no acute distress.  Skin: Skin is warm and dry. No rash noted.   Cardiovascular: Normal heart rate noted  Respiratory: Normal respiratory effort, no problems with respiration noted  Abdomen: Soft, gravid, appropriate for gestational age.  Pain/Pressure: Absent     Pelvic: Cervical exam deferred        Extremities: Normal range of motion.  Edema: Trace  Mental Status: Normal mood and affect. Normal behavior. Normal judgment and thought content.   Assessment and Plan:  Pregnancy: G1P0000 at [redacted]w[redacted]d  1. Encounter for supervision of low-risk first pregnancy, antepartum - GBS at Next visit  - Continue entering BP and weight in babyscripts   Preterm labor symptoms and general obstetric precautions including but not limited to vaginal bleeding, contractions, leaking of fluid and fetal movement were reviewed in detail with the patient. Please refer to After Visit Summary for other counseling  recommendations.  Return in about 3 weeks (around 05/16/2018).  Future Appointments  Date Time Provider Department Center  05/09/2018  8:15 AM Armando Reichert, [name] [facility]    Thressa Sheller, [name]

## 2018-05-09 ENCOUNTER — Encounter: Payer: Self-pay | Admitting: Advanced Practice Midwife

## 2018-05-09 ENCOUNTER — Other Ambulatory Visit (HOSPITAL_COMMUNITY)
Admission: RE | Admit: 2018-05-09 | Discharge: 2018-05-09 | Disposition: A | Discharge status: Home / Self Care | Payer: Medicaid Other | Source: Ambulatory Visit | Attending: Advanced Practice Midwife | Admitting: Advanced Practice Midwife

## 2018-05-09 ENCOUNTER — Ambulatory Visit (INDEPENDENT_AMBULATORY_CARE_PROVIDER_SITE_OTHER): Payer: Medicaid Other | Admitting: Advanced Practice Midwife

## 2018-05-09 VITALS — BP 133/88 | HR 88 | Wt 257.8 lb

## 2018-05-09 DIAGNOSIS — O133 Gestational [pregnancy-induced] hypertension without significant proteinuria, third trimester: Secondary | ICD-10-CM

## 2018-05-09 DIAGNOSIS — Z3400 Encounter for supervision of normal first pregnancy, unspecified trimester: Secondary | ICD-10-CM | POA: Diagnosis present

## 2018-05-09 DIAGNOSIS — O26843 Uterine size-date discrepancy, third trimester: Secondary | ICD-10-CM

## 2018-05-09 LAB — OB RESULTS CONSOLE GC/CHLAMYDIA: Gonorrhea: NEGATIVE

## 2018-05-09 LAB — POCT URINALYSIS DIP (DEVICE)
Bilirubin Urine: NEGATIVE
Glucose, UA: NEGATIVE mg/dL
Hgb urine dipstick: NEGATIVE
Ketones, ur: NEGATIVE mg/dL
Leukocytes, UA: NEGATIVE
Nitrite: NEGATIVE
Protein, ur: NEGATIVE mg/dL
Specific Gravity, Urine: 1.01 (ref 1.005–1.030)
Urobilinogen, UA: 0.2 mg/dL (ref 0.0–1.0)
pH: 6.5 (ref 5.0–8.0)

## 2018-05-09 LAB — OB RESULTS CONSOLE GBS: GBS: NEGATIVE

## 2018-05-09 MED ORDER — HYDROCORTISONE ACE-PRAMOXINE 1-1 % RE FOAM: 1.0000 | Freq: Two times a day (BID) | RECTAL | 3 refills | Status: DC

## 2018-05-09 NOTE — Patient Instructions (Addendum)
Preeclampsia and Eclampsia Preeclampsia is a serious condition that develops only during pregnancy. It is also called toxemia of pregnancy. This condition causes high blood pressure along with other symptoms, such as swelling and headaches. These symptoms may develop as the condition gets worse. Preeclampsia may occur at 20 weeks of pregnancy or later. Diagnosing and treating preeclampsia early is very important. If not treated early, it can cause serious problems for you and your baby. One problem it can lead to is eclampsia, which is a condition that causes muscle jerking or shaking (convulsions or seizures) in the mother. Delivering your baby is the best treatment for preeclampsia or eclampsia. Preeclampsia and eclampsia symptoms usually go away after your baby is born. What are the causes? The cause of preeclampsia is not known. What increases the risk? The following risk factors make you more likely to develop preeclampsia:  Being pregnant for the first time.  Having had preeclampsia during a past pregnancy.  Having a family history of preeclampsia.  Having high blood pressure.  Being pregnant with twins or triplets.  Being 46 or older.  Being African-American.  Having kidney disease or diabetes.  Having medical conditions such as lupus or blood diseases.  Being very overweight (obese).  What are the signs or symptoms? The earliest signs of preeclampsia are:  High blood pressure.  Increased protein in your urine. Your health care provider will check for this at every visit before you give birth (prenatal visit).  Other symptoms that may develop as the condition gets worse include:  Severe headaches.  Sudden weight gain.  Swelling of the hands, face, legs, and feet.  Nausea and vomiting.  Vision problems, such as blurred or double vision.  Numbness in the face, arms, legs, and feet.  Urinating less than usual.  Dizziness.  Slurred speech.  Abdominal pain,  especially upper abdominal pain.  Convulsions or seizures.  Symptoms generally go away after giving birth. How is this diagnosed? There are no screening tests for preeclampsia. Your health care provider will ask you about symptoms and check for signs of preeclampsia during your prenatal visits. You may also have tests that include:  Urine tests.  Blood tests.  Checking your blood pressure.  Monitoring your baby's heart rate.  Ultrasound.  How is this treated? You and your health care provider will determine the treatment approach that is best for you. Treatment may include:  Having more frequent prenatal exams to check for signs of preeclampsia, if you have an increased risk for preeclampsia.  Bed rest.  Reducing how much salt (sodium) you eat.  Medicine to lower your blood pressure.  Staying in the hospital, if your condition is severe. There, treatment will focus on controlling your blood pressure and the amount of fluids in your body (fluid retention).  You may need to take medicine (magnesium sulfate) to prevent seizures. This medicine may be given as an injection or through an IV tube.  Delivering your baby early, if your condition gets worse. You may have your labor started with medicine (induced), or you may have a cesarean delivery.  Follow these instructions at home: Eating and drinking   Drink enough fluid to keep your urine clear or pale yellow.  Eat a healthy diet that is low in sodium. Do not add salt to your food. Check nutrition labels to see how much sodium a food or beverage contains.  Avoid caffeine. Lifestyle  Do not use any products that contain nicotine or tobacco, such as cigarettes  and e-cigarettes. If you need help quitting, ask your health care provider.  Do not use alcohol or drugs.  Avoid stress as much as possible. Rest and get plenty of sleep. General instructions  Take over-the-counter and prescription medicines only as told by your  health care provider.  When lying down, lie on your side. This keeps pressure off of your baby.  When sitting or lying down, raise (elevate) your feet. Try putting some pillows underneath your lower legs.  Exercise regularly. Ask your health care provider what kinds of exercise are best for you.  Keep all follow-up and prenatal visits as told by your health care provider. This is important. How is this prevented? To prevent preeclampsia or eclampsia from developing during another pregnancy:  Get proper medical care during pregnancy. Your health care provider may be able to prevent preeclampsia or diagnose and treat it early.  Your health care provider may have you take a low-dose aspirin or a calcium supplement during your next pregnancy.  You may have tests of your blood pressure and kidney function after giving birth.  Maintain a healthy weight. Ask your health care provider for help managing weight gain during pregnancy.  Work with your health care provider to manage any long-term (chronic) health conditions you have, such as diabetes or kidney problems.  Contact a health care provider if:  You gain more weight than expected.  You have headaches.  You have nausea or vomiting.  You have abdominal pain.  You feel dizzy or light-headed. Get help right away if:  You develop sudden or severe swelling anywhere in your body. This usually happens in the legs.  You gain 5 lbs (2.3 kg) or more during one week.  You have severe: ? Abdominal pain. ? Headaches. ? Dizziness. ? Vision problems. ? Confusion. ? Nausea or vomiting.  You have a seizure.  You have trouble moving any part of your body.  You develop numbness in any part of your body.  You have trouble speaking.  You have any abnormal bleeding.  You pass out. This information is not intended to replace advice given to you by your health care provider. Make sure you discuss any questions you have with your health  care provider. Document Released: 12/04/2000 Document Revised: 08/04/2016 Document Reviewed: 07/13/2016 Elsevier Interactive Patient Education  2018 [employer].   AREA PEDIATRIC/FAMILY PRACTICE PHYSICIANS  [facility] [address] [place], [place]  [address] Phone - [phone]   Fax - [phone]  [facility] [address] [place], [place] [address] Phone - [phone]   Fax - [phone]  JACK AMOS [address] [place], [place]  [address] Phone - [phone]   Fax - [phone]  [facility] [address] [place], [place]  [address] Phone - [phone]   Fax - [phone]  [facility] [address] [place], [place]  [address] Phone - [phone]   Fax - [phone]  [facility] [address] [place], [place]  [address] Phone - [phone]   Fax - [phone]  [facility] [address] [place], [place]  [address] Phone - [phone]   Fax - [phone]  [facility] AT [facility] [address] [place], [address] [place], [place]  [address] Phone - [phone]   Fax - [phone]  [facility] AT [facility] [address] [place], [place]  [address] Phone -  [phone]   Fax - [phone] [facility] [address] [place], [place]  [address] Phone - [phone]   Fax - [phone]  [facility] [address] [place], [place]  [address] Phone - [phone]   Fax - [phone]  [facility] [address] [place], [place]  [address] Phone - [phone]   Fax - [phone]  [facility] [address] [place], [place]  [address] Phone - [phone]   Fax - [phone]  [facility] [address], [address] [place], [place]  [address] Phone -  [phone]  [facility] [address] [place], [place]  [address] Phone - [phone]   Fax - [phone]  [facility] [address] [place], [place]  [address] Phone - [phone]   Fax - [phone]  [facility] [address] [place], [place]  [address] Phone - [phone]   Fax - [phone]  [facility] [address] [place], [place]  [address] Phone - [phone]   Fax - [phone]   [facility] [address] [place], [place]  [address] Phone - [phone]   Fax - [phone]  [facility] [address] [place], [place]  [address] Phone - [phone]   Fax - [phone]  DAVID RUBIN [address] [place], [place]  [address] Phone - [phone]   Fax - [phone]  [facility] [address] [place], [place]  [address] Phone - [phone]   Fax - [phone]  [facility] Al[facility] Chyle [address] [place], [place]  [address] Phone - [phone]   Fax - [phone] Gerarda Fraction [address] [place], [place]  [address] Phone - [phone]   Fax - [phone]  [facility] [address] [place], [place]  [address] Phone - [phone]   Fax - [phone]  [facility] - [place] [address] [place], [place]  [address] Phone - [phone]   Fax - [phone]  [facility] [place] Wyvonne Lenz MD [address] [place] [address] Phone [phone]  Fax [phone]

## 2018-05-09 NOTE — Progress Notes (Signed)
   PRENATAL VISIT NOTE  Subjective:  Kim Allen is a 22 y.o. G1P0000 at 67w3dbeing seen today for ongoing prenatal care.  She is currently monitored for the following issues for this low-risk pregnancy and has Severe major depression without psychotic features ([place]; Cannabis use disorder, severe, dependence ([place]; and Supervision of low-risk first pregnancy on their problem list.  Patient reports no complaints.  Contractions: Not present. Vag. Bleeding: None.  Movement: Present. Denies leaking of fluid.   Patient denies HA, visual disturbances or RUQ pain.   The following portions of the patient's history were reviewed and updated as appropriate: allergies, current medications, past family history, past medical history, past social history, past surgical history and problem list. Problem list updated.  Objective:   Vitals:   05/09/18 0826  BP: 133/88  Pulse: 88  Weight: 257 lb 12.8 oz (116.9 kg)    Fetal Status: Fetal Heart Rate (bpm): 141 Fundal Height: 39 cm Movement: Present     General:  Alert, oriented and cooperative. Patient is in no acute distress.  Skin: Skin is warm and dry. No rash noted.   Cardiovascular: Normal heart rate noted  Respiratory: Normal respiratory effort, no problems with respiration noted  Abdomen: Soft, gravid, appropriate for gestational age.  Pain/Pressure: Absent     Pelvic: Cervical exam performed Dilation: Closed Effacement (%): Thick Station: Ballotable  Extremities: Normal range of motion.  Edema: Mild pitting, slight indentation  Mental Status: Normal mood and affect. Normal behavior. Normal judgment and thought content.   Assessment and Plan:  Pregnancy: G1P0000 at 341w3d1. Encounter for supervision of low-risk first pregnancy, antepartum - Culture, beta strep (group b only) - Cervicovaginal ancillary only  2. Transient hypertension of pregnancy in third trimester - CBC - Comp Met (CMET) - Protein / creatinine ratio, urine - Blood  pressure check in 1-2 days   3. S>D - Growth USKoreacheduled   Preterm labor symptoms and general obstetric precautions including but not limited to vaginal bleeding, contractions, leaking of fluid and fetal movement were reviewed in detail with the patient. Please refer to After Visit Summary for other counseling recommendations.  No follow-ups on file.  No future appointments.  HeMarcille BuffyCNM

## 2018-05-09 NOTE — Progress Notes (Signed)
9 lb weight gain noted. 1+ Edema noted .

## 2018-05-10 LAB — CERVICOVAGINAL ANCILLARY ONLY
Chlamydia: NEGATIVE
Neisseria Gonorrhea: NEGATIVE

## 2018-05-11 ENCOUNTER — Ambulatory Visit: Payer: Medicaid Other

## 2018-05-11 ENCOUNTER — Other Ambulatory Visit: Payer: Self-pay

## 2018-05-11 VITALS — BP 134/75 | HR 95 | Wt 258.0 lb

## 2018-05-11 DIAGNOSIS — Z3400 Encounter for supervision of normal first pregnancy, unspecified trimester: Secondary | ICD-10-CM

## 2018-05-11 DIAGNOSIS — O133 Gestational [pregnancy-induced] hypertension without significant proteinuria, third trimester: Secondary | ICD-10-CM

## 2018-05-11 NOTE — Progress Notes (Signed)
I have reviewed the chart and agree with nursing staff's documentation of this patient's encounter.  Vonzella Nipple, PA-C 05/11/2018 10:14 AM

## 2018-05-11 NOTE — Progress Notes (Signed)
Hypertension: Patient here for follow-up of elevated blood pressure. She is not exercising and is not adherent to low salt diet.  Blood pressure is well controlled at home. Cardiac symptoms none. Patient denies none.  Cardiovascular risk factors: none. Use of agents associated with hypertension: none. History of target organ damage: none.  Patient denies headache, dizziness and blurred vision.  Baby is moving well.  Vitals:   05/11/18 1002  BP: 134/75  Pulse: 95   Per Vonzella Nipple, no change to management.  Patient to follow up next week.

## 2018-05-12 ENCOUNTER — Other Ambulatory Visit: Payer: Self-pay | Admitting: Advanced Practice Midwife

## 2018-05-12 ENCOUNTER — Ambulatory Visit (HOSPITAL_COMMUNITY)
Admission: RE | Admit: 2018-05-12 | Discharge: 2018-05-12 | Disposition: A | Discharge status: Home / Self Care | Payer: Medicaid Other | Source: Ambulatory Visit | Attending: Advanced Practice Midwife | Admitting: Advanced Practice Midwife

## 2018-05-12 DIAGNOSIS — O99213 Obesity complicating pregnancy, third trimester: Secondary | ICD-10-CM | POA: Diagnosis not present

## 2018-05-12 DIAGNOSIS — Z362 Encounter for other antenatal screening follow-up: Secondary | ICD-10-CM

## 2018-05-12 DIAGNOSIS — Z3A35 35 weeks gestation of pregnancy: Secondary | ICD-10-CM | POA: Diagnosis not present

## 2018-05-12 DIAGNOSIS — O26843 Uterine size-date discrepancy, third trimester: Secondary | ICD-10-CM | POA: Diagnosis not present

## 2018-05-13 ENCOUNTER — Encounter: Payer: Self-pay | Admitting: Advanced Practice Midwife

## 2018-05-14 LAB — COMPREHENSIVE METABOLIC PANEL
ALT: 10 IU/L (ref 0–32)
AST: 19 IU/L (ref 0–40)
Albumin/Globulin Ratio: 1.3 (ref 1.2–2.2)
Albumin: 3.3 g/dL — ABNORMAL LOW (ref 3.5–5.5)
Alkaline Phosphatase: 134 IU/L — ABNORMAL HIGH (ref 39–117)
BUN/Creatinine Ratio: 12 (ref 9–23)
BUN: 5 mg/dL — ABNORMAL LOW (ref 6–20)
Bilirubin Total: <0.2 mg/dL (ref 0.0–1.2)
CO2: 19 mmol/L — ABNORMAL LOW (ref 20–29)
Calcium: 8.9 mg/dL (ref 8.7–10.2)
Chloride: 107 mmol/L — ABNORMAL HIGH (ref 96–106)
Creatinine, Ser: 0.43 mg/dL — ABNORMAL LOW (ref 0.57–1.00)
GFR calc Af Amer: 167 mL/min/{1.73_m2} (ref >59)
GFR calc non Af Amer: 145 mL/min/{1.73_m2} (ref >59)
Globulin, Total: 2.6 g/dL (ref 1.5–4.5)
Glucose: 86 mg/dL (ref 65–99)
Potassium: 4.1 mmol/L (ref 3.5–5.2)
Sodium: 139 mmol/L (ref 134–144)
Total Protein: 5.9 g/dL — ABNORMAL LOW (ref 6.0–8.5)

## 2018-05-14 LAB — CBC
Hematocrit: 30.9 % — ABNORMAL LOW (ref 34.0–46.6)
Hemoglobin: 10.1 g/dL — ABNORMAL LOW (ref 11.1–15.9)
MCH: 31.1 pg (ref 26.6–33.0)
MCHC: 32.7 g/dL (ref 31.5–35.7)
MCV: 95 fL (ref 79–97)
Platelets: 238 10*3/uL (ref 150–450)
RBC: 3.25 x10E6/uL — ABNORMAL LOW (ref 3.77–5.28)
RDW: 13.7 % (ref 12.3–15.4)
WBC: 10 10*3/uL (ref 3.4–10.8)

## 2018-05-14 LAB — PROTEIN / CREATININE RATIO, URINE
Creatinine, Urine: 31.4 mg/dL
Protein, Ur: 6.6 mg/dL
Protein/Creat Ratio: 210 mg/g creat — ABNORMAL HIGH (ref 0–200)

## 2018-05-14 LAB — CULTURE, BETA STREP (GROUP B ONLY): Strep Gp B Culture: NEGATIVE

## 2018-05-20 ENCOUNTER — Ambulatory Visit (INDEPENDENT_AMBULATORY_CARE_PROVIDER_SITE_OTHER): Payer: Medicaid Other | Admitting: Obstetrics and Gynecology

## 2018-05-20 ENCOUNTER — Other Ambulatory Visit: Payer: Self-pay | Admitting: Obstetrics and Gynecology

## 2018-05-20 VITALS — BP 128/79 | HR 83 | Wt 258.8 lb

## 2018-05-20 DIAGNOSIS — Z3400 Encounter for supervision of normal first pregnancy, unspecified trimester: Secondary | ICD-10-CM

## 2018-05-20 DIAGNOSIS — M79661 Pain in right lower leg: Secondary | ICD-10-CM

## 2018-05-20 DIAGNOSIS — F322 Major depressive disorder, single episode, severe without psychotic features: Secondary | ICD-10-CM

## 2018-05-20 MED ORDER — FLUOXETINE HCL 10 MG PO TABS: 10.0000 mg | ORAL_TABLET | Freq: Every day | ORAL | 1 refills | Status: DC

## 2018-05-20 NOTE — Progress Notes (Signed)
   PRENATAL VISIT NOTE  Subjective:  Kim Allen is a 22 y.o. G1P0000 at [redacted]w[redacted]d being seen today for ongoing prenatal care.  She is currently monitored for the following issues for this low-risk pregnancy and has Severe major depression without psychotic features (HCC); Cannabis use disorder, severe, dependence (HCC); Supervision of low-risk first pregnancy; and Macrosomia on their problem list.  Patient reports calf pain from a bruise on her RT calf sustaied in horse play with S.O. back in January. She expresses concern that the "bump" is still there and hurts a little..  Contractions: Not present.  .  Movement: Present. Denies leaking of fluid.   The following portions of the patient's history were reviewed and updated as appropriate: allergies, current medications, past family history, past medical history, past social history, past surgical history and problem list. Problem list updated.  Objective:   Vitals:   05/20/18 1053  BP: 128/79  Pulse: 83  Weight: 258 lb 12.8 oz (117.4 kg)    Fetal Status: Fetal Heart Rate (bpm): 139 Fundal Height: 39 cm Movement: Present     General:  Alert, oriented and cooperative. Patient is in no acute distress.  Skin: Skin is warm and dry. No rash noted.   Cardiovascular: Normal heart rate noted  Respiratory: Normal respiratory effort, no problems with respiration noted  Abdomen: Soft, gravid, appropriate for gestational age.  Pain/Pressure: Absent     Pelvic: Cervical exam deferred        Extremities: Normal range of motion.  Edema: Moderate pitting, indentation subsides rapidly, 4 cm x 2 cm soft, mobile soft tissue mass on RT shin, mild tenderness to palpation, no erythema or ecchymosis   Mental Status: Normal mood and affect. Normal behavior. Normal judgment and thought content.   Assessment and Plan:  Pregnancy: G1P0000 at [redacted]w[redacted]d  1. Pain in right shin - AMB referral to orthopedics  2. Encounter for supervision of low-risk first pregnancy,  antepartum - Labor precautions given  Term labor symptoms and general obstetric precautions including but not limited to vaginal bleeding, contractions, leaking of fluid and fetal movement were reviewed in detail with the patient. Please refer to After Visit Summary for other counseling recommendations.  Return in about 1 week (around 05/27/2018) for Return OB visit.  Raelyn Mora, [name]

## 2018-05-20 NOTE — Patient Instructions (Signed)
Braxton Hicks Contractions °Contractions of the uterus can occur throughout pregnancy, but they are not always a sign that you are in labor. You may have practice contractions called Braxton Hicks contractions. These false labor contractions are sometimes confused with true labor. °What are Braxton Hicks contractions? °Braxton Hicks contractions are tightening movements that occur in the muscles of the uterus before labor. Unlike true labor contractions, these contractions do not result in opening (dilation) and thinning of the cervix. Toward the end of pregnancy (32-34 weeks), Braxton Hicks contractions can happen more often and may become stronger. These contractions are sometimes difficult to tell apart from true labor because they can be very uncomfortable. You should not feel embarrassed if you go to the hospital with false labor. °Sometimes, the only way to tell if you are in true labor is for your health care provider to look for changes in the cervix. The health care provider will do a physical exam and may monitor your contractions. If you are not in true labor, the exam should show that your cervix is not dilating and your water has not broken. °If there are other health problems associated with your pregnancy, it is completely safe for you to be sent home with false labor. You may continue to have Braxton Hicks contractions until you go into true labor. °How to tell the difference between true labor and false labor °True labor °· Contractions last 30-70 seconds. °· Contractions become very regular. °· Discomfort is usually felt in the top of the uterus, and it spreads to the lower abdomen and low back. °· Contractions do not go away with walking. °· Contractions usually become more intense and increase in frequency. °· The cervix dilates and gets thinner. °False labor °· Contractions are usually shorter and not as strong as true labor contractions. °· Contractions are usually irregular. °· Contractions  are often felt in the front of the lower abdomen and in the groin. °· Contractions may go away when you walk around or change positions while lying down. °· Contractions get weaker and are shorter-lasting as time goes on. °· The cervix usually does not dilate or become thin. °Follow these instructions at home: °· Take over-the-counter and prescription medicines only as told by your health care provider. °· Keep up with your usual exercises and follow other instructions from your health care provider. °· Eat and drink lightly if you think you are going into labor. °· If Braxton Hicks contractions are making you uncomfortable: °? Change your position from lying down or resting to walking, or change from walking to resting. °? Sit and rest in a tub of warm water. °? Drink enough fluid to keep your urine pale yellow. Dehydration may cause these contractions. °? Do slow and deep breathing several times an hour. °· Keep all follow-up prenatal visits as told by your health care provider. This is important. °Contact a health care provider if: °· You have a fever. °· You have continuous pain in your abdomen. °Get help right away if: °· Your contractions become stronger, more regular, and closer together. °· You have fluid leaking or gushing from your vagina. °· You pass blood-tinged mucus (bloody show). °· You have bleeding from your vagina. °· You have low back pain that you never had before. °· You feel your baby’s head pushing down and causing pelvic pressure. °· Your baby is not moving inside you as much as it used to. °Summary °· Contractions that occur before labor are called Braxton   Hicks contractions, false labor, or practice contractions. °· Braxton Hicks contractions are usually shorter, weaker, farther apart, and less regular than true labor contractions. True labor contractions usually become progressively stronger and regular and they become more frequent. °· Manage discomfort from Braxton Hicks contractions by  changing position, resting in a warm bath, drinking plenty of water, or practicing deep breathing. °This information is not intended to replace advice given to you by your health care provider. Make sure you discuss any questions you have with your health care provider. °Document Released: 04/22/2017 Document Revised: 04/22/2017 Document Reviewed: 04/22/2017 °Elsevier Interactive Patient Education © 2018 [employer] Inc[employer] ° °

## 2018-05-27 ENCOUNTER — Ambulatory Visit: Payer: Medicaid Other | Admitting: Family Medicine

## 2018-05-30 ENCOUNTER — Ambulatory Visit (INDEPENDENT_AMBULATORY_CARE_PROVIDER_SITE_OTHER): Payer: Medicaid Other | Admitting: Advanced Practice Midwife

## 2018-05-30 VITALS — BP 141/79 | HR 83 | Wt 262.7 lb

## 2018-05-30 DIAGNOSIS — Z3403 Encounter for supervision of normal first pregnancy, third trimester: Secondary | ICD-10-CM

## 2018-05-30 NOTE — Patient Instructions (Signed)
Vaginal Delivery Vaginal delivery means that you will give birth by pushing your baby out of your birth canal (vagina). A team of health care providers will help you before, during, and after vaginal delivery. Birth experiences are unique for every woman and every pregnancy, and birth experiences vary depending on where you choose to give birth. What should I do to prepare for my baby's birth? Before your baby is born, it is important to talk with your health care provider about:  Your labor and delivery preferences. These may include: ? Medicines that you may be given. ? How you will manage your pain. This might include non-medical pain relief techniques or injectable pain relief such as epidural analgesia. ? How you and your baby will be monitored during labor and delivery. ? Who may be in the labor and delivery room with you. ? Your feelings about surgical delivery of your baby (cesarean delivery, or C-section) if this becomes necessary. ? Your feelings about receiving donated blood through an IV tube (blood transfusion) if this becomes necessary.  Whether you are able: ? To take pictures or videos of the birth. ? To eat during labor and delivery. ? To move around, walk, or change positions during labor and delivery.  What to expect after your baby is born, such as: ? Whether delayed umbilical cord clamping and cutting is offered. ? Who will care for your baby right after birth. ? Medicines or tests that may be recommended for your baby. ? Whether breastfeeding is supported in your hospital or birth center. ? How long you will be in the hospital or birth center.  How any medical conditions you have may affect your baby or your labor and delivery experience.  To prepare for your baby's birth, you should also:  Attend all of your health care visits before delivery (prenatal visits) as recommended by your health care provider. This is important.  Prepare your home for your baby's  arrival. Make sure that you have: ? Diapers. ? Baby clothing. ? Feeding equipment. ? Safe sleeping arrangements for you and your baby.  Install a car seat in your vehicle. Have your car seat checked by a certified car seat installer to make sure that it is installed safely.  Think about who will help you with your new baby at home for at least the first several weeks after delivery.  What can I expect when I arrive at the birth center or hospital? Once you are in labor and have been admitted into the hospital or birth center, your health care provider may:  Review your pregnancy history and any concerns you have.  Insert an IV tube into one of your veins. This is used to give you fluids and medicines.  Check your blood pressure, pulse, temperature, and heart rate (vital signs).  Check whether your bag of water (amniotic sac) has broken (ruptured).  Talk with you about your birth plan and discuss pain control options.  Monitoring Your health care provider may monitor your contractions (uterine monitoring) and your baby's heart rate (fetal monitoring). You may need to be monitored:  Often, but not continuously (intermittently).  All the time or for long periods at a time (continuously). Continuous monitoring may be needed if: ? You are taking certain medicines, such as medicine to relieve pain or make your contractions stronger. ? You have pregnancy or labor complications.  Monitoring may be done by:  Placing a special stethoscope or a handheld monitoring device on your abdomen to   check your baby's heartbeat, and feeling your abdomen for contractions. This method of monitoring does not continuously record your baby's heartbeat or your contractions.  Placing monitors on your abdomen (external monitors) to record your baby's heartbeat and the frequency and length of contractions. You may not have to wear external monitors all the time.  Placing monitors inside of your uterus  (internal monitors) to record your baby's heartbeat and the frequency, length, and strength of your contractions. ? Your health care provider may use internal monitors if he or she needs more information about the strength of your contractions or your baby's heart rate. ? Internal monitors are put in place by passing a thin, flexible wire through your vagina and into your uterus. Depending on the type of monitor, it may remain in your uterus or on your baby's head until birth. ? Your health care provider will discuss the benefits and risks of internal monitoring with you and will ask for your permission before inserting the monitors.  Telemetry. This is a type of continuous monitoring that can be done with external or internal monitors. Instead of having to stay in bed, you are able to move around during telemetry. Ask your health care provider if telemetry is an option for you.  Physical exam Your health care provider may perform a physical exam. This may include:  Checking whether your baby is positioned: ? With the head toward your vagina (head-down). This is most common. ? With the head toward the top of your uterus (head-up or breech). If your baby is in a breech position, your health care provider may try to turn your baby to a head-down position so you can deliver vaginally. If it does not seem that your baby can be born vaginally, your provider may recommend surgery to deliver your baby. In rare cases, you may be able to deliver vaginally if your baby is head-up (breech delivery). ? Lying sideways (transverse). Babies that are lying sideways cannot be delivered vaginally.  Checking your cervix to determine: ? Whether it is thinning out (effacing). ? Whether it is opening up (dilating). ? How low your baby has moved into your birth canal.  What are the three stages of labor and delivery?  Normal labor and delivery is divided into the following three stages: Stage 1  Stage 1 is the  longest stage of labor, and it can last for hours or days. Stage 1 includes: ? Early labor. This is when contractions may be irregular, or regular and mild. Generally, early labor contractions are more than 10 minutes apart. ? Active labor. This is when contractions get longer, more regular, more frequent, and more intense. ? The transition phase. This is when contractions happen very close together, are very intense, and may last longer than during any other part of labor.  Contractions generally feel mild, infrequent, and irregular at first. They get stronger, more frequent (about every 2-3 minutes), and more regular as you progress from early labor through active labor and transition.  Many women progress through stage 1 naturally, but you may need help to continue making progress. If this happens, your health care provider may talk with you about: ? Rupturing your amniotic sac if it has not ruptured yet. ? Giving you medicine to help make your contractions stronger and more frequent.  Stage 1 ends when your cervix is completely dilated to 4 inches (10 cm) and completely effaced. This happens at the end of the transition phase. Stage 2  Once   your cervix is completely effaced and dilated to 4 inches (10 cm), you may start to feel an urge to push. It is common for the body to naturally take a rest before feeling the urge to push, especially if you received an epidural or certain other pain medicines. This rest period may last for up to 1-2 hours, depending on your unique labor experience.  During stage 2, contractions are generally less painful, because pushing helps relieve contraction pain. Instead of contraction pain, you may feel stretching and burning pain, especially when the widest part of your baby's head passes through the vaginal opening (crowning).  Your health care provider will closely monitor your pushing progress and your baby's progress through the vagina during stage 2.  Your  health care provider may massage the area of skin between your vaginal opening and anus (perineum) or apply warm compresses to your perineum. This helps it stretch as the baby's head starts to crown, which can help prevent perineal tearing. ? In some cases, an incision may be made in your perineum (episiotomy) to allow the baby to pass through the vaginal opening. An episiotomy helps to make the opening of the vagina larger to allow more room for the baby to fit through.  It is very important to breathe and focus so your health care provider can control the delivery of your baby's head. Your health care provider may have you decrease the intensity of your pushing, to help prevent perineal tearing.  After delivery of your baby's head, the shoulders and the rest of the body generally deliver very quickly and without difficulty.  Once your baby is delivered, the umbilical cord may be cut right away, or this may be delayed for 1-2 minutes, depending on your baby's health. This may vary among health care providers, hospitals, and birth centers.  If you and your baby are healthy enough, your baby may be placed on your chest or abdomen to help maintain the baby's temperature and to help you bond with each other. Some mothers and babies start breastfeeding at this time. Your health care team will dry your baby and help keep your baby warm during this time.  Your baby may need immediate care if he or she: ? Showed signs of distress during labor. ? Has a medical condition. ? Was born too early (prematurely). ? Had a bowel movement before birth (meconium). ? Shows signs of difficulty transitioning from being inside the uterus to being outside of the uterus. If you are planning to breastfeed, your health care team will help you begin a feeding. Stage 3  The third stage of labor starts immediately after the birth of your baby and ends after you deliver the placenta. The placenta is an organ that develops  during pregnancy to provide oxygen and nutrients to your baby in the womb.  Delivering the placenta may require some pushing, and you may have mild contractions. Breastfeeding can stimulate contractions to help you deliver the placenta.  After the placenta is delivered, your uterus should tighten (contract) and become firm. This helps to stop bleeding in your uterus. To help your uterus contract and to control bleeding, your health care provider may: ? Give you medicine by injection, through an IV tube, by mouth, or through your rectum (rectally). ? Massage your abdomen or perform a vaginal exam to remove any blood clots that are left in your uterus. ? Empty your bladder by placing a thin, flexible tube (catheter) into your bladder. ? Encourage   you to breastfeed your baby. After labor is over, you and your baby will be monitored closely to ensure that you are both healthy until you are ready to go home. Your health care team will teach you how to care for yourself and your baby. This information is not intended to replace advice given to you by your health care provider. Make sure you discuss any questions you have with your health care provider. Document Released: 09/15/2008 Document Revised: 06/26/2016 Document Reviewed: 12/22/2015 Elsevier Interactive Patient Education  2018 [employer] Inc.  

## 2018-05-30 NOTE — Progress Notes (Signed)
   PRENATAL VISIT NOTE  Subjective:  Kim Allen is a 22 y.o. G1P0000 at 6557w3d being seen today for ongoing prenatal care.  She is currently monitored for the following issues for this low-risk pregnancy and has Severe major depression without psychotic features (HCC); Cannabis use disorder, severe, dependence (HCC); Supervision of low-risk first pregnancy; and Macrosomia on their problem list.  Patient reports no bleeding, no contractions, no cramping, no leaking and bilateral swollen legs, worse in evenings.  Contractions: Not present. Vag. Bleeding: None.  Movement: Present. Denies leaking of fluid.   The following portions of the patient's history were reviewed and updated as appropriate: allergies, current medications, past family history, past medical history, past social history, past surgical history and problem list. Problem list updated.  Objective:   Vitals:   05/30/18 1003  BP: (!) 141/79  Pulse: 83  Weight: 262 lb 11.2 oz (119.2 kg)    Fetal Status: Fetal Heart Rate (bpm): 143   Movement: Present     General:  Alert, oriented and cooperative. Patient is in no acute distress.  Skin: Skin is warm and dry. No rash noted.   Cardiovascular: Normal heart rate noted  Respiratory: Normal respiratory effort, no problems with respiration noted  Abdomen: Soft, gravid, appropriate for gestational age.  Pain/Pressure: Absent     Pelvic: Cervical exam deferred        Extremities: Normal range of motion.  Edema: Moderate pitting, indentation subsides rapidly  Mental Status: Normal mood and affect. Normal behavior. Normal judgment and thought content.   Assessment and Plan:  Pregnancy: G1P0000 at 2857w3d  1. Encounter for supervision of normal first pregnancy in third trimester --Cervical exam deferred per patient request --Elevation, PO hydration with water, and compression socks for foot/ankle swelling. Discussed swelling is normal when bilateral, normal color, occurs after  standing/walking for long periods of time.  Term labor symptoms and general obstetric precautions including but not limited to vaginal bleeding, contractions, leaking of fluid and fetal movement were reviewed in detail with the patient. Please refer to After Visit Summary for other counseling recommendations.  Return in about 1 week (around 06/06/2018).  No future appointments.  Calvert CantorSamantha C Tahisha Hakim, [name]  05/30/18  10:21 AM

## 2018-06-06 ENCOUNTER — Encounter (HOSPITAL_COMMUNITY): Payer: Self-pay | Admitting: *Deleted

## 2018-06-06 ENCOUNTER — Inpatient Hospital Stay (HOSPITAL_COMMUNITY)
Admission: AD | Admit: 2018-06-06 | Discharge: 2018-06-10 | DRG: 806 | Disposition: A | Discharge status: Home / Self Care | Payer: Medicaid Other | Attending: Family Medicine | Admitting: Family Medicine

## 2018-06-06 DIAGNOSIS — Z3A39 39 weeks gestation of pregnancy: Secondary | ICD-10-CM

## 2018-06-06 DIAGNOSIS — O99344 Other mental disorders complicating childbirth: Secondary | ICD-10-CM | POA: Diagnosis present

## 2018-06-06 DIAGNOSIS — O99214 Obesity complicating childbirth: Secondary | ICD-10-CM | POA: Diagnosis present

## 2018-06-06 DIAGNOSIS — O99324 Drug use complicating childbirth: Secondary | ICD-10-CM | POA: Diagnosis present

## 2018-06-06 DIAGNOSIS — F329 Major depressive disorder, single episode, unspecified: Secondary | ICD-10-CM | POA: Diagnosis present

## 2018-06-06 DIAGNOSIS — F1290 Cannabis use, unspecified, uncomplicated: Secondary | ICD-10-CM | POA: Diagnosis present

## 2018-06-06 DIAGNOSIS — O4212 Full-term premature rupture of membranes, onset of labor more than 24 hours following rupture: Secondary | ICD-10-CM | POA: Diagnosis not present

## 2018-06-06 DIAGNOSIS — O134 Gestational [pregnancy-induced] hypertension without significant proteinuria, complicating childbirth: Secondary | ICD-10-CM | POA: Diagnosis present

## 2018-06-06 DIAGNOSIS — O4290 Premature rupture of membranes, unspecified as to length of time between rupture and onset of labor, unspecified weeks of gestation: Secondary | ICD-10-CM | POA: Diagnosis present

## 2018-06-06 DIAGNOSIS — O4292 Full-term premature rupture of membranes, unspecified as to length of time between rupture and onset of labor: Secondary | ICD-10-CM | POA: Diagnosis present

## 2018-06-06 LAB — TYPE AND SCREEN
ABO/RH(D): O POS
Antibody Screen: NEGATIVE

## 2018-06-06 LAB — CBC
HCT: 32.9 % — ABNORMAL LOW (ref 36.0–46.0)
Hemoglobin: 10.7 g/dL — ABNORMAL LOW (ref 12.0–15.0)
MCH: 30.1 pg (ref 26.0–34.0)
MCHC: 32.5 g/dL (ref 30.0–36.0)
MCV: 92.7 fL (ref 78.0–100.0)
Platelets: 244 10*3/uL (ref 150–400)
RBC: 3.55 MIL/uL — ABNORMAL LOW (ref 3.87–5.11)
RDW: 14.3 % (ref 11.5–15.5)
WBC: 10.9 10*3/uL — ABNORMAL HIGH (ref 4.0–10.5)

## 2018-06-06 LAB — RAPID URINE DRUG SCREEN, HOSP PERFORMED
Amphetamines: NOT DETECTED
Benzodiazepines: NOT DETECTED
Cocaine: NOT DETECTED
Opiates: NOT DETECTED
Tetrahydrocannabinol: NOT DETECTED

## 2018-06-06 MED ORDER — LACTATED RINGERS IV SOLN
INTRAVENOUS | Status: DC
  Administered 2018-06-06 – 2018-06-08 (×4): via INTRAVENOUS

## 2018-06-06 MED ORDER — ACETAMINOPHEN 325 MG PO TABS: 650.0000 mg | ORAL_TABLET | ORAL | Status: DC | PRN

## 2018-06-06 MED ORDER — OXYTOCIN BOLUS FROM INFUSION
500.0000 mL | Freq: Once | INTRAVENOUS | Status: AC
  Administered 2018-06-08: 500 mL via INTRAVENOUS

## 2018-06-06 MED ORDER — TERBUTALINE SULFATE 1 MG/ML IJ SOLN
0.2500 mg | Freq: Once | INTRAMUSCULAR | Status: DC | PRN
  Filled 2018-06-06: qty 1

## 2018-06-06 MED ORDER — ONDANSETRON HCL 4 MG/2ML IJ SOLN
4.0000 mg | Freq: Four times a day (QID) | INTRAMUSCULAR | Status: DC | PRN
  Administered 2018-06-07: 4 mg via INTRAVENOUS
  Filled 2018-06-06: qty 2

## 2018-06-06 MED ORDER — LIDOCAINE HCL (PF) 1 % IJ SOLN
30.0000 mL | INTRAMUSCULAR | Status: DC | PRN
Start: 2018-06-06 — End: 2018-06-08
  Filled 2018-06-06: qty 30

## 2018-06-06 MED ORDER — OXYCODONE-ACETAMINOPHEN 5-325 MG PO TABS: 1.0000 | ORAL_TABLET | ORAL | Status: DC | PRN

## 2018-06-06 MED ORDER — OXYCODONE-ACETAMINOPHEN 5-325 MG PO TABS: 2.0000 | ORAL_TABLET | ORAL | Status: DC | PRN

## 2018-06-06 MED ORDER — FENTANYL CITRATE (PF) 100 MCG/2ML IJ SOLN
100.0000 ug | INTRAMUSCULAR | Status: DC | PRN
  Administered 2018-06-07 (×5): 100 ug via INTRAVENOUS
  Filled 2018-06-06 (×5): qty 2

## 2018-06-06 MED ORDER — MISOPROSTOL 50MCG HALF TABLET
50.0000 ug | ORAL_TABLET | ORAL | Status: DC
  Administered 2018-06-06 – 2018-06-07 (×3): 50 ug via ORAL
  Filled 2018-06-06 (×4): qty 1

## 2018-06-06 MED ORDER — SOD CITRATE-CITRIC ACID 500-334 MG/5ML PO SOLN: 30.0000 mL | ORAL | Status: DC | PRN

## 2018-06-06 MED ORDER — OXYTOCIN 40 UNITS IN LACTATED RINGERS INFUSION - SIMPLE MED: 2.5000 [IU]/h | INTRAVENOUS | Status: DC

## 2018-06-06 MED ORDER — LACTATED RINGERS IV SOLN: 500.0000 mL | INTRAVENOUS | Status: DC | PRN

## 2018-06-06 MED ORDER — FLEET ENEMA 7-19 GM/118ML RE ENEM: 1.0000 | ENEMA | RECTAL | Status: DC | PRN

## 2018-06-06 NOTE — Anesthesia Pain Management Evaluation Note (Signed)
  [facility] Pain Management Visit Note  Patient: Kim LamerGladys Kwasnik, 22 y.o., female  "[facility]. We have an anesthesia team available at all times to provide care throughout the hospital, including epidural management and anesthesia for C-section. I don't know your plan for the delivery whether it a natural birth, water birth, IV sedation, nitrous supplementation, doula or epidural, but we want to meet your pain goals."   1.Was your pain managed to your expectations on prior hospitalizations?   No prior hospitalizations  2.What is your expectation for pain management during this hospitalization?     Nitrous Oxide  3.How can we help you reach that goal? unsure  Record the patient's initial score and the patient's pain goal.   Pain: 0  Pain Goal: 8 [facility] wants you to be able to say your pain was always managed very well.  Cephus ShellingBURGER,Guenther Dunshee 06/06/2018

## 2018-06-06 NOTE — Progress Notes (Signed)
Patient ID: Kim Allen, female   DOB: Jul 01, 1996, 22 y.o.   MRN: [id number] Doing well Has been walking  Vitals:   06/06/18 1834 06/06/18 1933 06/06/18 2033 06/06/18 2133  BP: (!) 144/82 (!) 148/88 (!) 133/95   Pulse: (!) 103 91 94   Resp: 20 18 16    Temp:  98.7 F (37.1 C)  98.3 F (36.8 C)  TempSrc:  Oral  Oral  Weight:      Height:       FHR reactive, category I UCs irregular  Dilation: Fingertip Effacement (%): 80 Cervical Position: Posterior Station: -2 Presentation: Vertex Exam by:: Artelia LarocheM. Katessa Attridge, [name]  WIll do one more dose of Cytotec Unable to get foley in just yet

## 2018-06-06 NOTE — Progress Notes (Signed)
Belly band placed on pt, pt now walking the halls. POC discussed with pt and support persons. Pt verbalizes understanding that she will walk hallway for three laps, then sit in room so FHT can be assessed. Pt also verbalizes understanding that she can walk the hallway as long as FHT allows.

## 2018-06-06 NOTE — MAU Note (Signed)
Pt presents to MAU with complaints of leakage of fluid since 7 this morning. Denies any contractions. +FM

## 2018-06-06 NOTE — H&P (Addendum)
LABOR AND DELIVERY ADMISSION HISTORY AND PHYSICAL NOTE  Kim Allen is a 23 y.o. female G1P0000 with IUP at [redacted]w[redacted]d by 1st trimester [place] presenting for PROM. She reports leakage of clear fluid at 0700 this AM. She denies contractions or vaginal bleeding.   She reports positive fetal movement.   Prenatal History/Complications: PNC at [facility] Pregnancy complications:  - depression on fluoextine 10 mg daily  Past Medical History: Past Medical History:  Diagnosis Date  . Depression     Past Surgical History: Past Surgical History:  Procedure Laterality Date  . HERNIA REPAIR      Obstetrical History: OB History    Gravida  1   Para  0   Term  0   Preterm  0   AB  0   Living  0     SAB  0   TAB  0   Ectopic  0   Multiple  0   Live Births  0           Social History: Social History   Socioeconomic History  . Marital status: Single    Spouse name: Not on file  . Number of children: Not on file  . Years of education: Not on file  . Highest education level: Not on file  Occupational History  . Not on file  Social Needs  . Financial resource strain: Not on file  . Food insecurity:    Worry: Not on file    Inability: Not on file  . Transportation needs:    Medical: Not on file    Non-medical: Not on file  Tobacco Use  . Smoking status: Never Smoker  . Smokeless tobacco: Never Used  Substance and Sexual Activity  . Alcohol use: Yes    Comment: Not since finding out pregnant  . Drug use: Yes    Types: Marijuana    Comment: Not since finding out pregnant  . Sexual activity: Yes    Birth control/protection: Condom  Lifestyle  . Physical activity:    Days per week: Not on file    Minutes per session: Not on file  . Stress: Not on file  Relationships  . Social connections:    Talks on phone: Not on file    Gets together: Not on file    Attends religious service: Not on file    Active member of club or organization: Not on file    Attends meetings  of clubs or organizations: Not on file    Relationship status: Not on file  Other Topics Concern  . Not on file  Social History Narrative  . Not on file    Family History: Family History  Problem Relation Age of Onset  . Hypertension Mother   . Stroke Mother   . Hypertension Father   . Diabetes Maternal Aunt   . Diabetes Paternal Aunt   . Diabetes Paternal Uncle     Allergies: No Known Allergies  Medications Prior to Admission  Medication Sig Dispense Refill Last Dose  . FLUoxetine (PROZAC) 10 MG tablet Take 1 tablet (10 mg total) by mouth daily. 30 tablet 1 06/06/2018 at Unknown time  . phenylephrine-shark liver oil-mineral oil-petrolatum (PREPARATION H) 0.25-3-14-71.9 % rectal ointment Place 1 application rectally 2 (two) times daily as needed for hemorrhoids.   06/05/2018 at Unknown time  . Prenatal Vit-Fe Fumarate-FA (MULTIVITAMIN-PRENATAL) 27-0.8 MG TABS tablet Take 1 tablet by mouth daily at 12 noon.   06/06/2018 at Unknown time  Review of Systems  All systems reviewed and negative except as stated in HPI  Physical Exam Blood pressure 140/81, pulse 89, temperature 98.4 F (36.9 C), temperature source Oral, resp. rate 18, height 5\' 7"  (1.702 m), weight 119.7 kg (264 lb). General appearance: alert, oriented, NAD Lungs: normal respiratory effort Heart: regular rate Abdomen: soft, non-tender; gravid, FH appropriate for GA Extremities: No calf swelling or tenderness Presentation: cephalic by US in MAU Fetal monitoring: 130 bpm, moderate variability, + accel, no decel Uterine activity: none  SVE by Wynelle BourgeoisMarie Melford Tullier [name] closed/thick/high    Prenatal labs: ABO, Rh: O/Positive/-- (12/13 1356) Antibody: Negative (12/13 1356) Rubella: 11.10 (12/13 1356) RPR: Non Reactive (04/08 0901)  HBsAg: Negative (03/13 0853)  HIV: Non Reactive (04/08 0901)  GC/Chlamydia: negative GBS:   negative 2-hr GTT: negative (84/110/70) Genetic screening:  First trimester screen  negative Anatomy US: normal    Cervix closed/long/soft/-3/vtx  Prenatal Transfer Tool  Maternal Diabetes: No Genetic Screening: Normal Maternal Ultrasounds/Referrals: Abnormal:  Findings:   Other: US @ 35 w for s>d: EFW 3551g (>90%), AC >97%, HC 72% Fetal Ultrasounds or other Referrals:  None Maternal Substance Abuse:  Yes:  Type: Marijuana Significant Maternal Medications:  Meds include: Prozac Significant Maternal Lab Results: None  Results for orders placed or performed during the hospital encounter of 06/06/18 (from the past 24 hour(s))  CBC   Collection Time: 06/06/18  4:19 PM  Result Value Ref Range   WBC 10.9 (H) 4.0 - 10.5 K/uL   RBC 3.55 (L) 3.87 - 5.11 MIL/uL   Hemoglobin 10.7 (L) 12.0 - 15.0 g/dL   HCT 16.132.9 (L) 09.636.0 - 04.546.0 %   MCV 92.7 78.0 - 100.0 fL   MCH 30.1 26.0 - 34.0 pg   MCHC 32.5 30.0 - 36.0 g/dL   RDW 40.914.3 81.111.5 - 91.415.5 %   Platelets 244 150 - 400 K/uL    Patient Active Problem List   Diagnosis Date Noted  . PROM (premature rupture of membranes) 06/06/2018  . Macrosomia 05/13/2018  . Supervision of low-risk first pregnancy 12/02/2017  . Severe major depression without psychotic features (HCC) 05/22/2017  . Cannabis use disorder, severe, dependence (HCC) 05/22/2017    Assessment: Kim Allen is a 22 y.o. G1P0000 at 3447w3d here for PROM. Patient is not currently in labor.   #Labor: Augmentation of labor for PROM. Will start with cytotec 50 mcg orally q4h.  #Fetal macrosomia: discussed shoulder dystocia precautions. Pt is aware of possibility.  #THC use: UDS #Pain: Per patient request #FWB: Cat 1 FHT #ID:  GBS negative #MOF: breast #MOC:undecided #Circ:  Undecided  Will start with Cytotec PO for ripening  GrenadaBrittany P Hipkins 06/06/2018, 5:02 PM  I confirm that I have verified the information documented in the resident's note and that I have also personally reperformed the physical exam and all medical decision making activities. The patient was  seen and examined by me also Agree with note NST reactive and reassuring UCs as listed Cervical exams as listed in note  Unable to place Foley  Will start with Cytotec for ripening  Aviva SignsWilliams, Epic Tribbett L, [name]

## 2018-06-06 NOTE — MAU Provider Note (Signed)
History   [id number]   Chief Complaint  Patient presents with  . Rupture of Membranes    HPI Kim Allen is a 22 y.o. female  G1P0000 @39 .3 wks here with report of gush of fluid around 7am. Leaking of fluid has continued. Pt reports no contractions. She denies vaginal bleeding. Last intercourse was not recent. She reports +fetal movement. All other systems negative.    No LMP recorded. Patient is pregnant.  OB History  Gravida Para Term Preterm AB Living  1 0 0 0 0 0  SAB TAB Ectopic Multiple Live Births  0 0 0 0 0    # Outcome Date GA Lbr Len/2nd Weight Sex Delivery Anes PTL Lv  1 Current             Past Medical History:  Diagnosis Date  . Depression     Family History  Problem Relation Age of Onset  . Hypertension Mother   . Stroke Mother   . Hypertension Father   . Diabetes Maternal Aunt   . Diabetes Paternal Aunt   . Diabetes Paternal Uncle     Social History   Socioeconomic History  . Marital status: Single    Spouse name: Not on file  . Number of children: Not on file  . Years of education: Not on file  . Highest education level: Not on file  Occupational History  . Not on file  Social Needs  . Financial resource strain: Not on file  . Food insecurity:    Worry: Not on file    Inability: Not on file  . Transportation needs:    Medical: Not on file    Non-medical: Not on file  Tobacco Use  . Smoking status: Never Smoker  . Smokeless tobacco: Never Used  Substance and Sexual Activity  . Alcohol use: Yes    Comment: Not since finding out pregnant  . Drug use: Yes    Types: Marijuana    Comment: Not since finding out pregnant  . Sexual activity: Yes    Birth control/protection: Condom  Lifestyle  . Physical activity:    Days per week: Not on file    Minutes per session: Not on file  . Stress: Not on file  Relationships  . Social connections:    Talks on phone: Not on file    Gets together: Not on file    Attends religious service:  Not on file    Active member of club or organization: Not on file    Attends meetings of clubs or organizations: Not on file    Relationship status: Not on file  Other Topics Concern  . Not on file  Social History Narrative  . Not on file    No Known Allergies  No current facility-administered medications on file prior to encounter.    Current Outpatient Medications on File Prior to Encounter  Medication Sig Dispense Refill  . FLUoxetine (PROZAC) 10 MG tablet Take 1 tablet (10 mg total) by mouth daily. 30 tablet 1  . Prenatal Vit-Fe Fumarate-FA (MULTIVITAMIN-PRENATAL) 27-0.8 MG TABS tablet Take 1 tablet by mouth daily at 12 noon.       Review of Systems  Gastrointestinal: Negative for abdominal pain.  Genitourinary: Positive for vaginal discharge. Negative for vaginal bleeding.     Physical Exam  BP 119/60   Pulse (!) 104   Temp 98.7 F (37.1 C)   Resp 16   Physical Exam  Nursing note reviewed. Constitutional: She is  oriented to person, place, and time. She appears well-developed and well-nourished. No distress.  HENT:  Head: Normocephalic and atraumatic.  Neck: Normal range of motion.  Respiratory: Effort normal. No respiratory distress.  Genitourinary:  Genitourinary Comments: SSE: +pool, fern positive  Musculoskeletal: Normal range of motion.  Neurological: She is alert and oriented to person, place, and time.  Skin: Skin is warm and dry.  Psychiatric: She has a normal mood and affect.  EFM: 135 bpm, mod variability, + accels, no decels Toco: rare  No results found for this or any previous visit (from the past 24 hour(s)).  MAU Course  Procedures  MDM SROM confirmed. Vtx confirmed by [place]. Admit to BS.   Assessment and Plan  [redacted] weeks gestation PROM at term Admit to [facility] per labor team   Donette Larry, [place] 06/06/2018 4:08 PM

## 2018-06-06 NOTE — MAU Note (Signed)
Urine sent to lab 

## 2018-06-07 ENCOUNTER — Inpatient Hospital Stay (HOSPITAL_COMMUNITY): Payer: Medicaid Other | Admitting: Anesthesiology

## 2018-06-07 ENCOUNTER — Other Ambulatory Visit: Payer: Self-pay

## 2018-06-07 LAB — COMPREHENSIVE METABOLIC PANEL
ALT: 16 U/L (ref 14–54)
AST: 27 U/L (ref 15–41)
Albumin: 3 g/dL — ABNORMAL LOW (ref 3.5–5.0)
Alkaline Phosphatase: 195 U/L — ABNORMAL HIGH (ref 38–126)
Anion gap: 8 (ref 5–15)
BUN: 7 mg/dL (ref 6–20)
CO2: 18 mmol/L — ABNORMAL LOW (ref 22–32)
Calcium: 9 mg/dL (ref 8.9–10.3)
Chloride: 107 mmol/L (ref 101–111)
Creatinine, Ser: 0.54 mg/dL (ref 0.44–1.00)
GFR calc Af Amer: >60 mL/min (ref >60)
GFR calc non Af Amer: >60 mL/min (ref >60)
Glucose, Bld: 96 mg/dL (ref 65–99)
Potassium: 4.6 mmol/L (ref 3.5–5.1)
Sodium: 133 mmol/L — ABNORMAL LOW (ref 135–145)
Total Bilirubin: 0.4 mg/dL (ref 0.3–1.2)
Total Protein: 6.7 g/dL (ref 6.5–8.1)

## 2018-06-07 LAB — CBC
HCT: 31.3 % — ABNORMAL LOW (ref 36.0–46.0)
Hemoglobin: 10.1 g/dL — ABNORMAL LOW (ref 12.0–15.0)
MCH: 30.1 pg (ref 26.0–34.0)
MCHC: 32.3 g/dL (ref 30.0–36.0)
MCV: 93.4 fL (ref 78.0–100.0)
Platelets: 219 10*3/uL (ref 150–400)
RBC: 3.35 MIL/uL — ABNORMAL LOW (ref 3.87–5.11)
RDW: 14.4 % (ref 11.5–15.5)
WBC: 11.6 10*3/uL — ABNORMAL HIGH (ref 4.0–10.5)

## 2018-06-07 LAB — PROTEIN / CREATININE RATIO, URINE
Creatinine, Urine: 24 mg/dL
Total Protein, Urine: <6 mg/dL

## 2018-06-07 LAB — RPR: RPR Ser Ql: NONREACTIVE

## 2018-06-07 MED ORDER — LACTATED RINGERS IV SOLN: 500.0000 mL | Freq: Once | INTRAVENOUS | Status: DC

## 2018-06-07 MED ORDER — EPHEDRINE 5 MG/ML INJ
10.0000 mg | INTRAVENOUS | Status: DC | PRN
  Filled 2018-06-07: qty 2

## 2018-06-07 MED ORDER — FENTANYL 2.5 MCG/ML BUPIVACAINE 1/10 % EPIDURAL INFUSION (WH - ANES): 14.0000 mL/h | INTRAMUSCULAR | Status: DC | PRN

## 2018-06-07 MED ORDER — FENTANYL 2.5 MCG/ML BUPIVACAINE 1/10 % EPIDURAL INFUSION (WH - ANES)
14.0000 mL/h | INTRAMUSCULAR | Status: DC | PRN
  Administered 2018-06-07 – 2018-06-08 (×3): 14 mL/h via EPIDURAL
  Filled 2018-06-07 (×3): qty 100

## 2018-06-07 MED ORDER — PHENYLEPHRINE 40 MCG/ML (10ML) SYRINGE FOR IV PUSH (FOR BLOOD PRESSURE SUPPORT)
80.0000 ug | PREFILLED_SYRINGE | INTRAVENOUS | Status: DC | PRN
  Filled 2018-06-07: qty 5
  Filled 2018-06-07: qty 10

## 2018-06-07 MED ORDER — OXYTOCIN 40 UNITS IN LACTATED RINGERS INFUSION - SIMPLE MED
1.0000 m[IU]/min | INTRAVENOUS | Status: DC
  Administered 2018-06-07: 2 m[IU]/min via INTRAVENOUS
  Filled 2018-06-07: qty 1000

## 2018-06-07 MED ORDER — LIDOCAINE HCL (PF) 1 % IJ SOLN
INTRAMUSCULAR | Status: DC | PRN
  Administered 2018-06-07 (×2): 4 mL via EPIDURAL

## 2018-06-07 MED ORDER — MISOPROSTOL 25 MCG QUARTER TABLET
25.0000 ug | ORAL_TABLET | ORAL | Status: DC
  Administered 2018-06-07: 25 ug via VAGINAL
  Filled 2018-06-07 (×2): qty 1

## 2018-06-07 MED ORDER — DIPHENHYDRAMINE HCL 50 MG/ML IJ SOLN: 12.5000 mg | INTRAMUSCULAR | Status: DC | PRN

## 2018-06-07 NOTE — Progress Notes (Addendum)
Patient ID: Kim Allen, female   DOB: Apr 25, 1996, 22 y.o.   MRN: [id number] Kim Allen is a 22 y.o. G1P0000 at [redacted]w[redacted]d.  Subjective: Sleeping through UC's. No HA, vision changes, epigastric pain.   Objective: BP 131/80 (BP Location: Left Arm)   Pulse 89   Temp 98.4 F (36.9 C) (Oral)   Resp 16   Ht 5\' 7"  (1.702 m)   Wt 264 lb (119.7 kg)   BMI 41.35 kg/m   Patient Vitals for the past 24 hrs:  BP Temp Temp src Pulse Resp Height Weight  06/07/18 0249 131/80 - - 89 16 - -  06/07/18 0133 (!) 134/56 98.4 F (36.9 C) Oral 83 17 - -  06/07/18 0034 (!) 145/96 - - 92 16 - -  06/07/18 0014 - - - 100 - - -  06/06/18 2328 (!) 146/81 97.8 F (36.6 C) Oral 93 18 - -  06/06/18 2240 (!) 141/85 - - 91 20 - -  06/06/18 2149 (!) 147/83 - - 85 18 - -  06/06/18 2133 - 98.3 F (36.8 C) Oral - - - -  06/06/18 2033 (!) 133/95 - - 94 16 - -  06/06/18 1933 (!) 148/88 98.7 F (37.1 C) Oral 91 18 - -  06/06/18 1834 (!) 144/82 - - (!) 103 20 - -  06/06/18 1815 - - - - 18 - -  06/06/18 1714 138/80 - - 92 20 - -  06/06/18 1700 - - - - 18 - -  06/06/18 1649 - - - - - 5\' 7"  (1.702 m) 264 lb (119.7 kg)  06/06/18 1640 140/81 98.4 F (36.9 C) Oral 89 18 - -  06/06/18 1519 119/60 - - (!) 104 - - -  06/06/18 1516 - 98.7 F (37.1 C) - - 16 - -     FHT:  FHR: 125 bpm, variability: mod,  accelerations:  15x15,  decelerations:  none UC:   Q 4-8 minutes, mild Dilation: 1 Effacement (%): 50 Cervical Position: Posterior Station: Ballotable Presentation: Vertex Exam by:: [place], [name]   Attempted foley bulb placement for cervical ripening--unsuccessful.   Labs: Results for orders placed or performed during the hospital encounter of 06/06/18 (from the past 24 hour(s))  Urine rapid drug screen (hosp performed)     Status: Abnormal   Collection Time: 06/06/18  2:50 PM  Result Value Ref Range   Opiates NONE DETECTED NONE DETECTED   Cocaine NONE DETECTED NONE DETECTED   Benzodiazepines NONE DETECTED  NONE DETECTED   Amphetamines NONE DETECTED NONE DETECTED   Tetrahydrocannabinol NONE DETECTED NONE DETECTED   Barbiturates (A) NONE DETECTED    Result not available. Reagent lot number recalled by manufacturer.  CBC     Status: Abnormal   Collection Time: 06/06/18  4:19 PM  Result Value Ref Range   WBC 10.9 (H) 4.0 - 10.5 K/uL   RBC 3.55 (L) 3.87 - 5.11 MIL/uL   Hemoglobin 10.7 (L) 12.0 - 15.0 g/dL   HCT 78.2 (L) 95.6 - 21.3 %   MCV 92.7 78.0 - 100.0 fL   MCH 30.1 26.0 - 34.0 pg   MCHC 32.5 30.0 - 36.0 g/dL   RDW 08.6 57.8 - 46.9 %   Platelets 244 150 - 400 K/uL  Type and screen [facility]     Status: None   Collection Time: 06/06/18  4:19 PM  Result Value Ref Range   ABO/RH(D) O POS    Antibody Screen NEG  Sample Expiration      06/09/2018 Performed at [facility], [address] [place], [place] [id number]     Assessment / Plan: 7889w4d week IUP Labor: PROM, IOL, progressing w. cytotec Fetal Wellbeing:  Category I Pain Control:  Fentanyl for attempted foley placement  Anticipated MOD:  SVD Continue Cytotec. May consider attempting foley bulb placement w/ speculum if little progress over the next 4-8 hours.  Gestational Hypertension. Will add CMET and protein creatinine ratio.   Kim Allen, [place], [name] 06/07/2018 2:58 AM

## 2018-06-07 NOTE — Progress Notes (Signed)
Labor Progress Note Kim LamerGladys Allen is a 22 y.o. G1P0000 at 8116w4d presented for augmentation of labor for PROM. S: FB out at 1315. Not feeling any contractions.    O:  BP 140/83   Pulse 92   Temp 98.2 F (36.8 C) (Oral)   Resp 20   Ht 5\' 7"  (1.702 m)   Wt 119.7 kg (264 lb)   BMI 41.35 kg/m  EFM: 130 bpm/mod variability/+accel, no decel  CVE: Dilation: 4 Effacement (%): 60 Cervical Position: Posterior Station: -3 Presentation: Vertex Exam by:: Dr. Mearl LatinHipkins   A&P: 22 y.o. G1P0000 2316w4d presenting with PROM at 0700 on 6/17, here for augmentation of labor.  #Labor: s/p cytotec and FB. FB out at 1315. Starting pitocin now.  #Pain: per patient request #FWB: cat 1 FHT #GBS negative #gHTN: new diagnosis since admission. Mild range Bps. Asymptomatic. AST/ALT 27/16, Cr 0.54, platelets 244.   Kim GageBrittany P [name], MD 1:53 PM

## 2018-06-07 NOTE — Progress Notes (Signed)
Labor Progress Note Darene LamerGladys Kamara is a 22 y.o. G1P0000 at 6677w4d presented for augmentation of labor for PROM. S: Feeling more comfortable after epidural placement.    O:  BP 132/75   Pulse 85   Temp 98.6 F (37 C) (Oral)   Resp 16   Ht 5\' 7"  (1.702 m)   Wt 119.7 kg (264 lb)   SpO2 100%   BMI 41.35 kg/m  EFM: 130 bpm/mod variability/+accel, + occ decel  CVE: Dilation: 5 Effacement (%): 70, 80 Cervical Position: Posterior Station: -2 Presentation: Vertex Exam by:: Lorn Junes. [place], RN   A&P: 22 y.o. G1P0000 4677w4d presenting with PROM at 0700 on 6/17, here for augmentation of labor.  #Labor: s/p cytotec and FB. FB out at 1315. Now on pitocin. IUPC placed and forebag ruptured with clear fluid at 1915. Anticipte SVD.  #Pain: per patient request #FWB: cat II FHT for occ decels otherwise reassuring #GBS negative #gHTN: new diagnosis since admission. Mild range Bps. Asymptomatic. AST/ALT 27/16, Cr 0.54, platelets 244.   Felicita GageBrittany P Serenity Batley, MD 7:14 PM

## 2018-06-07 NOTE — Anesthesia Preprocedure Evaluation (Signed)
Anesthesia Evaluation  Patient identified by MRN, date of birth, ID band Patient awake    Reviewed: Allergy & Precautions, Patient's Chart, lab work & pertinent test results  Airway Mallampati: II  TM Distance: >3 FB Neck ROM: Full    Dental  (+) Teeth Intact   Pulmonary neg pulmonary ROS,    Pulmonary exam normal breath sounds clear to auscultation       Cardiovascular negative cardio ROS Normal cardiovascular exam Rhythm:Regular Rate:Normal     Neuro/Psych PSYCHIATRIC DISORDERS Depression negative neurological ROS     GI/Hepatic negative GI ROS, Neg liver ROS,   Endo/Other  Morbid obesity  Renal/GU negative Renal ROS     Musculoskeletal negative musculoskeletal ROS (+)   Abdominal (+) + obese,   Peds  Hematology negative hematology ROS (+)   Anesthesia Other Findings   Reproductive/Obstetrics (+) Pregnancy                             Anesthesia Physical Anesthesia Plan  ASA: III  Anesthesia Plan: Epidural   Post-op Pain Management:    Induction:   PONV Risk Score and Plan:   Airway Management Planned:   Additional Equipment:   Intra-op Plan:   Post-operative Plan:   Informed Consent: I have reviewed the patients History and Physical, chart, labs and discussed the procedure including the risks, benefits and alternatives for the proposed anesthesia with the patient or authorized representative who has indicated his/her understanding and acceptance.     Plan Discussed with:   Anesthesia Plan Comments:         Anesthesia Quick Evaluation

## 2018-06-07 NOTE — Progress Notes (Signed)
Labor Progress Note Darene LamerGladys Lofton is a 22 y.o. G1P0000 at 3720w4d presented for augmentation of labor for PROM. S: Feeling frustrated and her cervix is sore.   O:  BP 140/80   Pulse 88   Temp 98.7 F (37.1 C) (Oral)   Resp 18   Ht 5\' 7"  (1.702 m)   Wt 119.7 kg (264 lb)   BMI 41.35 kg/m  EFM: 140 bpm/mod variability/+accel, no decel  CVE: Dilation: 1 Effacement (%): 50 Cervical Position: Posterior Station: Ballotable Presentation: Vertex(by ultrasound) Exam by:: dr Darik Massing   A&P: 22 y.o. G1P0000 4020w4d presenting with PROM at 0700 on 6/17, here for augmentation of labor.  #Labor: SVE remains 1/50/-3 after 6 doses of buccal cytotec. Will trial vaginal 25 mcg cytotec now. FB placed at 0945.  #Pain: per patient request #FWB: cat 1 FHT #GBS negative #gHTN: new diagnosis since admission. Mild range Bps. Asymptomatic. AST/ALT 27/16, Cr 0.54, platelets 244.   Felicita GageBrittany P Sammye Staff, MD 9:51 AM

## 2018-06-07 NOTE — Anesthesia Procedure Notes (Signed)
Epidural Patient location during procedure: OB Start time: 06/07/2018 6:19 PM End time: 06/07/2018 6:31 PM  Staffing Anesthesiologist: Lewie LoronGermeroth, Fareedah Mahler, MD Performed: anesthesiologist   Preanesthetic Checklist Completed: patient identified, pre-op evaluation, timeout performed, IV checked, risks and benefits discussed and monitors and equipment checked  Epidural Patient position: sitting Prep: site prepped and draped and DuraPrep Patient monitoring: heart rate, continuous pulse ox and blood pressure Approach: midline Location: L3-L4 Injection technique: LOR air and LOR saline  Needle:  Needle type: Tuohy  Needle gauge: 17 G Needle length: 9 cm Needle insertion depth: 9 cm Catheter type: closed end flexible Catheter size: 19 Gauge Catheter at skin depth: 14 cm Test dose: negative  Assessment Sensory level: T8 Events: blood not aspirated, injection not painful, no injection resistance, negative IV test and no paresthesia  Additional Notes Reason for block:procedure for pain

## 2018-06-07 NOTE — Progress Notes (Signed)
Education completed with pt, reminding her about going back into the room after 3 laps so proper FHT can be recorded. Pt verbalizes understanding and stated "I will stay in the room for 20 minutes"

## 2018-06-07 NOTE — Progress Notes (Signed)
RN notified MD that pt was turned to left side and an IV fluid bolus was started d/t decrease in FHR variability and late decels.

## 2018-06-08 ENCOUNTER — Encounter (HOSPITAL_COMMUNITY): Payer: Self-pay

## 2018-06-08 DIAGNOSIS — O4212 Full-term premature rupture of membranes, onset of labor more than 24 hours following rupture: Secondary | ICD-10-CM

## 2018-06-08 DIAGNOSIS — Z3A39 39 weeks gestation of pregnancy: Secondary | ICD-10-CM

## 2018-06-08 MED ORDER — MEASLES, MUMPS & RUBELLA VAC ~~LOC~~ INJ: 0.5000 mL | INJECTION | Freq: Once | SUBCUTANEOUS | Status: DC

## 2018-06-08 MED ORDER — MISOPROSTOL 200 MCG PO TABS
ORAL_TABLET | ORAL | Status: AC
  Filled 2018-06-08: qty 5

## 2018-06-08 MED ORDER — SODIUM CHLORIDE 0.9% FLUSH: 3.0000 mL | Freq: Two times a day (BID) | INTRAVENOUS | Status: DC

## 2018-06-08 MED ORDER — DIBUCAINE 1 % RE OINT
1.0000 "application " | TOPICAL_OINTMENT | RECTAL | Status: DC | PRN
  Administered 2018-06-10: 1 via RECTAL
  Filled 2018-06-08: qty 28

## 2018-06-08 MED ORDER — SENNOSIDES-DOCUSATE SODIUM 8.6-50 MG PO TABS
2.0000 | ORAL_TABLET | ORAL | Status: DC
  Administered 2018-06-08 – 2018-06-10 (×2): 2 via ORAL
  Filled 2018-06-08 (×2): qty 2

## 2018-06-08 MED ORDER — WITCH HAZEL-GLYCERIN EX PADS: 1.0000 "application " | MEDICATED_PAD | CUTANEOUS | Status: DC | PRN

## 2018-06-08 MED ORDER — ONDANSETRON HCL 4 MG/2ML IJ SOLN: 4.0000 mg | INTRAMUSCULAR | Status: DC | PRN

## 2018-06-08 MED ORDER — TETANUS-DIPHTH-ACELL PERTUSSIS 5-2.5-18.5 LF-MCG/0.5 IM SUSP: 0.5000 mL | Freq: Once | INTRAMUSCULAR | Status: DC

## 2018-06-08 MED ORDER — PRENATAL MULTIVITAMIN CH
1.0000 | ORAL_TABLET | Freq: Every day | ORAL | Status: DC
  Administered 2018-06-08 – 2018-06-10 (×3): 1 via ORAL
  Filled 2018-06-08 (×3): qty 1

## 2018-06-08 MED ORDER — MISOPROSTOL 200 MCG PO TABS
1000.0000 ug | ORAL_TABLET | Freq: Once | ORAL | Status: AC
  Administered 2018-06-08: 1000 ug via RECTAL

## 2018-06-08 MED ORDER — FLEET ENEMA 7-19 GM/118ML RE ENEM: 1.0000 | ENEMA | Freq: Every day | RECTAL | Status: DC | PRN

## 2018-06-08 MED ORDER — SODIUM CHLORIDE 0.9 % IV SOLN: 250.0000 mL | INTRAVENOUS | Status: DC | PRN

## 2018-06-08 MED ORDER — ZOLPIDEM TARTRATE 5 MG PO TABS: 5.0000 mg | ORAL_TABLET | Freq: Every evening | ORAL | Status: DC | PRN

## 2018-06-08 MED ORDER — SIMETHICONE 80 MG PO CHEW: 80.0000 mg | CHEWABLE_TABLET | ORAL | Status: DC | PRN

## 2018-06-08 MED ORDER — SODIUM CHLORIDE 0.9% FLUSH: 3.0000 mL | INTRAVENOUS | Status: DC | PRN

## 2018-06-08 MED ORDER — IBUPROFEN 600 MG PO TABS
600.0000 mg | ORAL_TABLET | Freq: Four times a day (QID) | ORAL | Status: DC
  Administered 2018-06-08 – 2018-06-10 (×9): 600 mg via ORAL
  Filled 2018-06-08 (×9): qty 1

## 2018-06-08 MED ORDER — BISACODYL 10 MG RE SUPP: 10.0000 mg | Freq: Every day | RECTAL | Status: DC | PRN

## 2018-06-08 MED ORDER — ACETAMINOPHEN 325 MG PO TABS
650.0000 mg | ORAL_TABLET | ORAL | Status: DC | PRN
  Administered 2018-06-08: 650 mg via ORAL
  Filled 2018-06-08: qty 2

## 2018-06-08 MED ORDER — FLUOXETINE HCL 10 MG PO CAPS
10.0000 mg | ORAL_CAPSULE | Freq: Every day | ORAL | Status: DC
  Administered 2018-06-08 – 2018-06-10 (×2): 10 mg via ORAL
  Filled 2018-06-08 (×4): qty 1

## 2018-06-08 MED ORDER — COCONUT OIL OIL: 1.0000 "application " | TOPICAL_OIL | Status: DC | PRN

## 2018-06-08 MED ORDER — ONDANSETRON HCL 4 MG PO TABS: 4.0000 mg | ORAL_TABLET | ORAL | Status: DC | PRN

## 2018-06-08 MED ORDER — BENZOCAINE-MENTHOL 20-0.5 % EX AERO
1.0000 "application " | INHALATION_SPRAY | CUTANEOUS | Status: DC | PRN
  Administered 2018-06-08: 1 via TOPICAL
  Filled 2018-06-08: qty 56

## 2018-06-08 MED ORDER — DIPHENHYDRAMINE HCL 25 MG PO CAPS: 25.0000 mg | ORAL_CAPSULE | Freq: Four times a day (QID) | ORAL | Status: DC | PRN

## 2018-06-08 NOTE — Lactation Note (Signed)
This note was copied from a baby'Allen chart. Lactation Consultation Note  Patient Name: Kim Allen OZHYQ'MToday'Allen Date: 06/08/2018 Reason for consult: Initial assessment;Difficult latch;Term Breastfeeding consultation services and support information given to patient.  Baby is 7 hours old and has had breast attempts. Baby positioned in football hold on left.  Nipples semi flat.  Baby sucking on his tongue and wont open to latch.  A 20 mm nipple shield applied and baby did latch well.  Observed a 20 minute feeding and baby continued to feed when LC left.  Colostrum seen in shield and swallows noted.  Mom chooses to breast and formula feed.  Instructed to feed with any feeding cue and call out for assist prn.  Maternal Data Has patient been taught Hand Expression?: Yes Does the patient have breastfeeding experience prior to this delivery?: No  Feeding Feeding Type: Breast Fed Length of feed: 30 min  LATCH Score Latch: Grasps breast easily, tongue down, lips flanged, rhythmical sucking.(with nipple shield)  Audible Swallowing: Spontaneous and intermittent  Type of Nipple: Flat  Comfort (Breast/Nipple): Soft / non-tender  Hold (Positioning): Assistance needed to correctly position infant at breast and maintain latch.  LATCH Score: 8  Interventions Interventions: Breast feeding basics reviewed;Assisted with latch;Breast compression;Skin to skin;Adjust position;Breast massage;Support pillows;Hand express  Lactation Tools Discussed/Used Tools: Nipple Shields Nipple shield size: 20   Consult Status Consult Status: Follow-up Date: 06/09/18 Follow-up type: In-patient    [name], Kim Allen 06/08/2018, 2:07 PM

## 2018-06-08 NOTE — Progress Notes (Signed)
Patient ID: Kim Allen, female   DOB: 01-30-1996, 22 y.o.   MRN: [id number] Kim Allen is a 22 y.o. G1P0000 at 5749w5d admitted for induction of labor due to PROM.  Subjective: Comfortable w/ epidural, feeling some pressure  Objective: BP 127/63 (BP Location: Left Arm)   Pulse 92   Temp 98.7 F (37.1 C) (Oral)   Resp 16   Ht 5\' 7"  (1.702 m)   Wt 119.7 kg (264 lb)   SpO2 98%   BMI 41.35 kg/m  Total I/O In: -  Out: 1350 [Urine:1350]  FHT:  FHR: 120 bpm, variability: moderate,  accelerations:  Present,  decelerations:  Present earlies UC:   q 2-675mins, MVUs inadequate- however is making cervical change (effacement/station)  SVE:   Dilation: 8 Effacement (%): 90 Station: 0 Exam by:: Kim RifeK. Kim Allen, [name]  Pitocin @ 22 mu/min  Labs: Lab Results  Component Value Date   WBC 11.6 (H) 06/07/2018   HGB 10.1 (L) 06/07/2018   HCT 31.3 (L) 06/07/2018   MCV 93.4 06/07/2018   PLT 219 06/07/2018    Assessment / Plan: IOL d/t PROM, continue pitocin, will get in exagerrated sims w/ peanut ball  Labor: Progressing normally Fetal Wellbeing:  Category I Pain Control:  Epidural Pre-eclampsia: n/a I/D:  n/a Anticipated MOD:  NSVD  Kim MarkerKimberly R Ahsan Allen [name], WHNP-BC 06/08/2018, 2:14 AM

## 2018-06-08 NOTE — Anesthesia Postprocedure Evaluation (Signed)
Anesthesia Post Note  Patient: Kim LamerGladys Allen  Procedure(s) Performed: AN AD HOC LABOR EPIDURAL     Patient location during evaluation: Mother Baby Anesthesia Type: Epidural Level of consciousness: awake, awake and alert and oriented Pain management: pain level controlled Vital Signs Assessment: post-procedure vital signs reviewed and stable Respiratory status: spontaneous breathing Cardiovascular status: blood pressure returned to baseline Postop Assessment: no headache, epidural receding, patient able to bend at knees, adequate PO intake, no backache, no apparent nausea or vomiting and able to ambulate Anesthetic complications: no    Last Vitals:  Vitals:   06/08/18 0932 06/08/18 1325  BP: 132/84 136/85  Pulse: 79 76  Resp: 17   Temp: 37.2 C 37 C  SpO2:      Last Pain:  Vitals:   06/08/18 1325  TempSrc: Oral  PainSc:    Pain Goal: Patients Stated Pain Goal: 3 (06/07/18 1118)               Peyton BottomsBrowder, [name]

## 2018-06-08 NOTE — Progress Notes (Signed)
Pt taking 30 minute break from pushing per [name] order given FHR decel at beginning of pushing.

## 2018-06-08 NOTE — Progress Notes (Signed)
Patient ID: Kim Allen, female   DOB: [birth year], 22 y.o.   MRN: [id number] Kim Allen is a 22 y.o. G1P0000 at 7861w5d [facility]  Called to room by RN for prolonged decel w/ pushing  Subjective: Pt comfortable, no complaints  Objective: BP 137/82 (BP Location: Left Arm)   Pulse 90   Temp 99.3 F (37.4 C) (Oral)   Resp 19   Ht 5\' 7"  (1.702 m)   Wt 119.7 kg (264 lb)   SpO2 98%   BMI 41.35 kg/m  Total I/O In: -  Out: 1350 [Urine:1350]  FHT:  FHR: 125 bpm, variability: moderate,  accelerations:  Present,  decelerations:  Present 7min prolonged w/ initiation of pushing. Pitocin had been turned off prior to my entrance, O2 on, position changes, FSE placed by me, FHR returned to baseline UC:   regular, every 2-4 minutes  SVE:   Dilation: 10 Effacement (%): 100 Station: Plus 2 Exam by:: Kim FisherZ. Parrish, RN  Pitocin: off, was @ 7222mu/min  Labs: Lab Results  Component Value Date   WBC 11.6 (H) 06/07/2018   HGB 10.1 (L) 06/07/2018   HCT 31.3 (L) 06/07/2018   MCV 93.4 06/07/2018   PLT 219 06/07/2018    Assessment / Plan: IOL d/t PROM, 7min prolonged w/ initiation of pushing. Pitocin currently off, will let baby recover then resume pushing  Labor: Progressing normally Fetal Wellbeing:  Category II Pain Control:  Epidural Pre-eclampsia: n/a I/D:  n/a Anticipated MOD:  NSVD  Kim Allen [name], WHNP-BC 06/08/2018, 0300

## 2018-06-08 NOTE — Progress Notes (Signed)
RN spoke with [name]. Informed [name] that ctx had spaced out and requested to start pitocin. RN informed [name] that pitocin had been off for almost an hour, it was at 22 milli-units/min when it was turned off, and what the provider wanted to have the pitocin started at. Both RN and [name] noted that ctx were barely there. RN told to start pitocin back at 2 milli-units and to begin pushing once pt is in  Regular pattern and contracting adequately.

## 2018-06-09 ENCOUNTER — Encounter: Payer: Self-pay | Admitting: Student

## 2018-06-09 ENCOUNTER — Encounter (HOSPITAL_COMMUNITY): Payer: Self-pay | Admitting: General Practice

## 2018-06-09 LAB — CBC
HCT: 23.3 % — ABNORMAL LOW (ref 36.0–46.0)
Hemoglobin: 7.7 g/dL — ABNORMAL LOW (ref 12.0–15.0)
MCH: 30.6 pg (ref 26.0–34.0)
MCHC: 33 g/dL (ref 30.0–36.0)
MCV: 92.5 fL (ref 78.0–100.0)
Platelets: 146 10*3/uL — ABNORMAL LOW (ref 150–400)
RBC: 2.52 MIL/uL — ABNORMAL LOW (ref 3.87–5.11)
RDW: 14.6 % (ref 11.5–15.5)
WBC: 12.1 10*3/uL — ABNORMAL HIGH (ref 4.0–10.5)

## 2018-06-09 NOTE — Progress Notes (Signed)
Post Partum Day #1 Subjective: no complaints, up ad lib and tolerating PO; BPs stable overnight; breastfeeding; plans Nexplanon for pp contraception  Objective: Blood pressure 123/70, pulse 70, temperature 98 F (36.7 C), temperature source Oral, resp. rate 20, height 5\' 7"  (1.702 m), weight 119.7 kg (264 lb), SpO2 100 %, unknown if currently breastfeeding.  Physical Exam:  General: alert, cooperative and no distress Lochia: appropriate Uterine Fundus: firm DVT Evaluation: No evidence of DVT seen on physical exam.  Recent Labs    06/07/18 1744 06/09/18 0649  HGB 10.1* 7.7*  HCT 31.3* 23.3*    Assessment/Plan: Plan for discharge tomorrow  Needs BP check in 1 week   LOS: 3 days   SHAW, KIMBERLY [name] 06/09/2018, 8:12 AM

## 2018-06-10 MED ORDER — IBUPROFEN 600 MG PO TABS: 600.0000 mg | ORAL_TABLET | Freq: Four times a day (QID) | ORAL | 0 refills | Status: AC

## 2018-06-10 NOTE — Discharge Summary (Signed)
OB Discharge Summary     Patient Name: Kim Allen DOB: 1996/10/02 MRN: [id number]  Date of admission: 06/06/2018 Delivering MD: Shawna ClampBOOKER, [name]   Date of discharge: 06/10/2018  Admitting diagnosis: 39 WKS, LEAKING FLUIDS Intrauterine pregnancy: 5968w5d     Secondary diagnosis:  Active Problems:   PROM (premature rupture of membranes)  Additional problems: Intrapartum hypertension     Discharge diagnosis: Term Pregnancy Delivered                                                                                                Post partum procedures:none  Augmentation: AROM, Pitocin, Cytotec and Foley Balloon  Complications: None  Hospital course:  Induction of Labor With Vaginal Delivery   22 y.o. yo G1P1001 at 6768w5d was admitted to the hospital 06/06/2018 for induction of labor.  Indication for induction: PROM.  Patient had an uncomplicated labor course as follows: Membrane Rupture Time/Date: 7:00 AM ,06/06/2018   Intrapartum Procedures: Episiotomy: None [1]                                         Lacerations:  1st degree [2];Labial [10]  Patient had delivery of a Viable infant.  Information for the patient's newborn:  Kim Allen, Boy Kim Allen [[id number]][[id number]]      06/08/2018  Details of delivery can be found in separate delivery note.  Patient had a routine postpartum course. Patient is discharged home 06/10/18.  Physical exam  Vitals:   06/09/18 0210 06/09/18 0537 06/09/18 1710 06/09/18 2220  BP: 135/70 123/70 134/74 132/82  Pulse: 79 70 92 78  Resp: 18 20 18    Temp: 99.1 F (37.3 C) 98 F (36.7 C) 98.6 F (37 C) 98.4 F (36.9 C)  TempSrc: Oral Oral Oral Oral  SpO2:      Weight:      Height:       General: alert, cooperative and no distress Lochia: appropriate Uterine Fundus: firm Incision: N/A DVT Evaluation: No evidence of DVT seen on physical exam. Labs: Lab Results  Component Value Date   WBC 12.1 (H) 06/09/2018   HGB 7.7 (L) 06/09/2018   HCT 23.3 (L)  06/09/2018   MCV 92.5 06/09/2018   PLT 146 (L) 06/09/2018   CMP Latest Ref Rng & Units 06/06/2018  Glucose 65 - 99 mg/dL 96  BUN 6 - 20 mg/dL 7  Creatinine 2.530.44 - 6.641.00 mg/dL 4.030.54  Sodium 474135 - 259145 mmol/L 133(L)  Potassium 3.5 - 5.1 mmol/L 4.6  Chloride 101 - 111 mmol/L 107  CO2 22 - 32 mmol/L 18(L)  Calcium 8.9 - 10.3 mg/dL 9.0  Total Protein 6.5 - 8.1 g/dL 6.7  Total Bilirubin 0.3 - 1.2 mg/dL 0.4  Alkaline Phos 38 - 126 U/L 195(H)  AST 15 - 41 U/L 27  ALT 14 - 54 U/L 16    Discharge instruction: per After Visit Summary and "Baby and Me Booklet".  After visit meds:  Allergies as of 06/10/2018   No Known Allergies  Medication List    TAKE these medications   FLUoxetine 10 MG tablet Commonly known as:  PROZAC Take 1 tablet (10 mg total) by mouth daily.   ibuprofen 600 MG tablet Commonly known as:  ADVIL,MOTRIN Take 1 tablet (600 mg total) by mouth every 6 (six) hours.   multivitamin-prenatal 27-0.8 MG Tabs tablet Take 1 tablet by mouth daily at 12 noon.   phenylephrine-shark liver oil-mineral oil-petrolatum 0.25-3-14-71.9 % rectal ointment Commonly known as:  PREPARATION H Place 1 application rectally 2 (two) times daily as needed for hemorrhoids.       Diet: routine diet  Activity: Advance as tolerated. Pelvic rest for 6 weeks.   Outpatient follow up:6 weeks Follow up Appt: Future Appointments  Date Time Provider Department Center  06/15/2018  9:00 AM [facility] NURSE [facility]  06/15/2018  9:30 AM [facility]INICIAN [facility]  07/13/2018  2:55 PM Judeth Horn, NP [facility]   Follow up Visit:No follow-ups on file.  Postpartum contraception: Nexplanon  Newborn Data: Live born female  Birth Weight: 9 lb 4.3 oz (4205 g) APGAR: 5, 9  Newborn Delivery   Birth date/time:  06/08/2018 06:18:00 Delivery type:  Vaginal, Spontaneous     Baby Feeding: Bottle and Breast Disposition:home with mother   06/10/2018 [name],  MD  Patient seen and examined, agree with above note. Patient is 2 d s/p SVD. Has h/o HTN, appropriate f/u scheduled. Stable for discharge.  [name] 06/10/2018 3:35 PM

## 2018-06-10 NOTE — Lactation Note (Signed)
This note was copied from a baby's chart. Lactation Consultation Note  Patient Name: Kim Allen ZOXWR'UToday's Date: 06/10/2018 Reason for consult: Follow-up assessment Mom pumping some but not obtaining milk yet.  Reassured and discussed milk coming to volume.  Instructed to pump every 3 hours or sooner to establish milk supply.  Mom has a pump at home and also has WIC.  Mom denies questions or concerns.  Maternal Data    Feeding Feeding Type: Bottle Fed - Formula Nipple Type: Slow - flow  LATCH Score                   Interventions    Lactation Tools Discussed/Used     Consult Status Consult Status: Complete Follow-up type: Call as needed    [name], [name] 06/10/2018, 11:09 AM

## 2018-06-10 NOTE — Progress Notes (Addendum)
CSW received consult for hx of Anxiety/Depression, SA hx,  and Edinburgh score of 10  CSW met with MOB to offer support and complete assessment.    When CSW arrived, MOB was resting in bed, infant was asleep in bassinet, and MOB's 2 guest was observing infant sleeping. With MOB's permission, CSW asked MOB's guest leave in order to meet with MOB in private. MOB was polite, easy to engage, and receptive to meeting with CSW.  CSW asked about MOB's thoughts and feelings about being a new mother and MOB reported that MOB was happy and felt like she was bonded with her son.   CSW reviewed MOB's Edinburgh score and provided education regarding the baby blues period vs. perinatal mood disorders. CSW discussed treatment and gave resources for mental health follow up if concerns arise.  CSW recommends self-evaluation during the postpartum time period using the New Mom Checklist from Postpartum Progress and encouraged MOB to contact a medical professional if symptoms are noted at any time.  MOB communicated a hx of depression and a suicide attempt in 2018.  MOB reported being in a unhealthy relationship and was unable to find a resolution.  CSW assessed for safety and MOB denied any current SI and HI.  MOB also denied being in a DV relationship.  CSW offered MOB outpatient counseling resources and MOB declined.  MOB shared that M[facility]. MOB also reported being on medication and how the medication helps manages her symptoms.   MOB stated the MOB has a good support team and feels prepared to parent.  MOB reported having all essential items needed for infant.     MOB asked about MOB's SA hx and MOB denied the use of all illicit substance.  MOB shared that MOB smoked marijuana prior to MOB's pregnancy confirmation. CSW shared hospital's SA policy and MOB was understanding.  CSW made MOB aware that infant's UDS was negative and that CSW will continue to monitor infant's CDS.   CSW shared that if infant's CDS is positive without an explanation, CSW will make a report to [place] [facility] CSW identifies no further need for intervention and no barriers to discharge at this time.  Annisten Manchester Boyd-Gilyard, MSW, LCSW Clinical Social Work [phone] 

## 2018-06-10 NOTE — Discharge Instructions (Signed)
Vaginal Delivery, Care After °Refer to this sheet in the next few weeks. These instructions provide you with information about caring for yourself after vaginal delivery. Your health care provider may also give you more specific instructions. Your treatment has been planned according to current medical practices, but problems sometimes occur. Call your health care provider if you have any problems or questions. °What can I expect after the procedure? °After vaginal delivery, it is common to have: °· Some bleeding from your vagina. °· Soreness in your abdomen, your vagina, and the area of skin between your vaginal opening and your anus (perineum). °· Pelvic cramps. °· Fatigue. ° °Follow these instructions at home: °Medicines °· Take over-the-counter and prescription medicines only as told by your health care provider. °· If you were prescribed an antibiotic medicine, take it as told by your health care provider. Do not stop taking the antibiotic until it is finished. °Driving ° °· Do not drive or operate heavy machinery while taking prescription pain medicine. °· Do not drive for 24 hours if you received a sedative. °Lifestyle °· Do not drink alcohol. This is especially important if you are breastfeeding or taking medicine to relieve pain. °· Do not use tobacco products, including cigarettes, chewing tobacco, or e-cigarettes. If you need help quitting, ask your health care provider. °Eating and drinking °· Drink at least 8 eight-ounce glasses of water every day unless you are told not to by your health care provider. If you choose to breastfeed your baby, you may need to drink more water than this. °· Eat high-fiber foods every day. These foods may help prevent or relieve constipation. High-fiber foods include: °? Whole grain cereals and breads. °? Brown rice. °? Beans. °? Fresh fruits and vegetables. °Activity °· Return to your normal activities as told by your health care provider. Ask your health care provider  what activities are safe for you. °· Rest as much as possible. Try to rest or take a nap when your baby is sleeping. °· Do not lift anything that is heavier than your baby or 10 lb (4.5 kg) until your health care provider says that it is safe. °· Talk with your health care provider about when you can engage in sexual activity. This may depend on your: °? Risk of infection. °? Rate of healing. °? Comfort and desire to engage in sexual activity. °Vaginal Care °· If you have an episiotomy or a vaginal tear, check the area every day for signs of infection. Check for: °? More redness, swelling, or pain. °? More fluid or blood. °? Warmth. °? Pus or a bad smell. °· Do not use tampons or douches until your health care provider says this is safe. °· Watch for any blood clots that may pass from your vagina. These may look like clumps of dark red, brown, or black discharge. °General instructions °· Keep your perineum clean and dry as told by your health care provider. °· Wear loose, comfortable clothing. °· Wipe from front to back when you use the toilet. °· Ask your health care provider if you can shower or take a bath. If you had an episiotomy or a perineal tear during labor and delivery, your health care provider may tell you not to take baths for a certain length of time. °· Wear a bra that supports your breasts and fits you well. °· If possible, have someone help you with household activities and help care for your baby for at least a few days after   you leave the hospital. °· Keep all follow-up visits for you and your baby as told by your health care provider. This is important. °Contact a health care provider if: °· You have: °? Vaginal discharge that has a bad smell. °? Difficulty urinating. °? Pain when urinating. °? A sudden increase or decrease in the frequency of your bowel movements. °? More redness, swelling, or pain around your episiotomy or vaginal tear. °? More fluid or blood coming from your episiotomy or  vaginal tear. °? Pus or a bad smell coming from your episiotomy or vaginal tear. °? A fever. °? A rash. °? Little or no interest in activities you used to enjoy. °? Questions about caring for yourself or your baby. °· Your episiotomy or vaginal tear feels warm to the touch. °· Your episiotomy or vaginal tear is separating or does not appear to be healing. °· Your breasts are painful, hard, or turn red. °· You feel unusually sad or worried. °· You feel nauseous or you vomit. °· You pass large blood clots from your vagina. If you pass a blood clot from your vagina, save it to show to your health care provider. Do not flush blood clots down the toilet without having your health care provider look at them. °· You urinate more than usual. °· You are dizzy or light-headed. °· You have not breastfed at all and you have not had a menstrual period for 12 weeks after delivery. °· You have stopped breastfeeding and you have not had a menstrual period for 12 weeks after you stopped breastfeeding. °Get help right away if: °· You have: °? Pain that does not go away or does not get better with medicine. °? Chest pain. °? Difficulty breathing. °? Blurred vision or spots in your vision. °? Thoughts about hurting yourself or your baby. °· You develop pain in your abdomen or in one of your legs. °· You develop a severe headache. °· You faint. °· You bleed from your vagina so much that you fill two sanitary pads in one hour. °This information is not intended to replace advice given to you by your health care provider. Make sure you discuss any questions you have with your health care provider. °Document Released: 12/04/2000 Document Revised: 05/20/2016 Document Reviewed: 12/22/2015 °Elsevier Interactive Patient Education © 2018 [employer] ° °

## 2018-06-15 ENCOUNTER — Telehealth: Payer: Self-pay | Admitting: *Deleted

## 2018-06-15 ENCOUNTER — Ambulatory Visit: Payer: Self-pay

## 2018-06-15 NOTE — BH Specialist Note (Deleted)
Integrated Behavioral Health Initial Visit  MRN: [id number] Name: Darene LamerGladys Huberty  Number of Integrated Behavioral Health Clinician visits:: 3/6 Session Start time: ***  Session End time: *** Total time: {IBH Total Time:21014050}  Type of Service: Integrated Behavioral Health- Individual/Family Interpretor:{yes [id number]}no:[id number]} Interpretor Name and Language: ***   Warm Hand Off Completed.       SUBJECTIVE: Darene LamerGladys Hernandes is a [age] y.o. female accompanied by {CHL AMB ACCOMPANIED YN:[id number]}BY:[id number]} Patient was referred by *** for ***. Patient reports the following symptoms/concerns: *** Duration of problem: ***; Severity of problem: {Mild/Moderate/Severe:20260}  OBJECTIVE: Mood: {BHH MOOD:22306} and Affect: {BHH AFFECT:22307} Risk of harm to self or others: {CHL AMB BH Suicide Current Mental Status:21022748}  LIFE CONTEXT: Family and Social: *** School/Work: *** Self-Care: *** Life Changes: ***  GOALS ADDRESSED: Patient will: 1. Reduce symptoms of: {IBH Symptoms:21014056} 2. Increase knowledge and/or ability of: {IBH Patient Tools:21014057}  3. Demonstrate ability to: {IBH Goals:21014053}  INTERVENTIONS: Interventions utilized: {IBH Interventions:21014054}  Standardized Assessments completed: {IBH Screening Tools:21014051}  ASSESSMENT: Patient currently experiencing ***.   Patient may benefit from ***.  PLAN: 1. Follow up with behavioral health clinician on : *** 2. Behavioral recommendations: *** 3. Referral(s): {IBH Referrals:21014055} 4. "From scale of 1-10, how likely are you to follow plan?": ***  Valetta CloseJamie C McMannes, LCSW

## 2018-06-15 NOTE — Telephone Encounter (Signed)
Kim Allen bp check and bhc. I called her to inform her. She states she didn't know what appt was for and she called and they said would call her back. I informed her we would like to reschedule and I would have registrar call her with new appt for this week. She voices understanding.

## 2018-06-16 ENCOUNTER — Telehealth: Payer: Self-pay | Admitting: General Practice

## 2018-06-16 NOTE — Telephone Encounter (Signed)
[place] Johnson from [facility] called and left message on the nurse voicemail line stating she has been going out to the patient's house weekly for blood pressure checks and knows the patient missed a recent appt with us for that. She wants to know if she can continue to monitor the patient's BP so she doesn't have to come in. [place]'s call back number is [phone]

## 2018-06-24 ENCOUNTER — Ambulatory Visit: Payer: Self-pay

## 2018-06-30 ENCOUNTER — Telehealth: Payer: Self-pay | Admitting: *Deleted

## 2018-06-30 NOTE — Telephone Encounter (Signed)
Kim Allen with [facility] left message on the nurse voicemail with concerns regarding patient's blood pressure readings.  Also states patient has been trying to reach us to schedule an appointment.  Phoned Kim to obtain readings - see below 06/16/18 - 134/90 weight 239 lb 06/27/18 - 132/98 weight 239 lb 06/29/18 - 114/68 weight 237 lb.  Spoke with Dr. Macon LargeAnyanwu, normal readings, no additional interventions needed.    Will send my chart message to patient and will include patient's appointment dates/times.

## 2018-07-04 ENCOUNTER — Ambulatory Visit (INDEPENDENT_AMBULATORY_CARE_PROVIDER_SITE_OTHER): Payer: Medicaid Other | Admitting: Clinical

## 2018-07-04 DIAGNOSIS — F331 Major depressive disorder, recurrent, moderate: Secondary | ICD-10-CM | POA: Diagnosis not present

## 2018-07-04 MED ORDER — FLUOXETINE HCL 10 MG PO CAPS: 10.0000 mg | ORAL_CAPSULE | Freq: Every day | ORAL | 3 refills | Status: AC

## 2018-07-04 NOTE — BH Specialist Note (Signed)
Integrated Behavioral Health Initial Visit  MRN: [id number] Name: Kim Allen  Number of Integrated Behavioral Health Clinician visits:: 3/6 Session Start time: 1:12  Session End time: 1:42 Total time: 30 minutes  Type of Service: Integrated Behavioral Health- Individual/Family Interpretor:No. Interpretor Name and Language: n/a   Warm Hand Off Completed.       SUBJECTIVE: Kim Allen is a 22 y.o. female accompanied by newborn son Patient was referred by Thressa Sheller, [name] for depression and anxiety. Patient reports the following symptoms/concerns: Pt finds that adjusting to new motherhood is more difficult than expected; FOB's family has been supportive, pt is eating and sleeping well, and is bonding well with baby. Pt states that Prozac is helping to manage her symptoms of depression and anxiety, but that she has only one pill remaining. Pt is waiting for Medicaid to go through to establish with a PCP.  Duration of problem: Increase starting in pregnancy ; Severity of problem: moderate  OBJECTIVE:.phq Mood: Normal and Affect: Appropriate Risk of harm to self or others: No plan to harm self or others  LIFE CONTEXT: Family and Social: FOB's family has been supportive postpartum School/Work: Maternity leave Self-Care: Eating and sleeping well postpartum Life Changes: Recent childbirth  GOALS ADDRESSED: Patient will: 1. Reduce symptoms of: anxiety, depression and stress 2. Increase knowledge and/or ability of: healthy habits  3. Demonstrate ability to: Increase healthy adjustment to current life circumstances and Increase motivation to adhere to plan of care  INTERVENTIONS: Interventions utilized: Motivational Interviewing and Medication Monitoring  Standardized Assessments completed: GAD-7 and PHQ 9  ASSESSMENT: Patient currently experiencing Major depressive disorder, recurrent, moderate   Patient may benefit from continued brief therapeutic interventions regarding  coping with symptoms of depression and anxiety.  PLAN: 1. Follow up with behavioral health clinician on : One week briefly, at postpartum visit 2. Behavioral recommendations:  -Continue taking Prozac, as prescribed by medical provider -Continue using self-coping strategies to help manage symptoms of depression/anxiety -Contact Medicaid provider to establish type of Medicaid; pick a PCP if full Medicaid -[facility] walk-in clinic to establish care for therapy and [facility] med management 3. Referral(s): Integrated [facility] (In Clinic) and [facility] (LME/Outside Clinic) 4. "From scale of 1-10, how likely are you to follow plan?": 7  Rae Lips, LCSW       Depression screen [facility] 2/9 07/04/2018 05/20/2018 05/09/2018 03/02/2018 01/05/2018  Decreased Interest 2 1 1 2 1   Down, Depressed, Hopeless 3 2 1 2 2   PHQ - 2 Score 5 3 2 4 3   Altered sleeping 1 1 2 1 1   Tired, decreased energy 2 3 2 3 1   Change in appetite 0 0 1 1 1   Feeling bad or failure about yourself  3 2 1 2 3   Trouble concentrating 2 2 1 2 1   Moving slowly or fidgety/restless 0 0 0 0 0  Suicidal thoughts 0 0 0 0 1  PHQ-9 Score 13 11 9 13 11   Difficult doing work/chores - Somewhat difficult - - -   GAD 7 : Generalized Anxiety Score 07/04/2018 05/20/2018 05/09/2018 03/02/2018  Nervous, Anxious, on Edge 1 2 2 2   Control/stop worrying 3 3 3 3   Worry too much - different things 3 3 3 3   Trouble relaxing 3 3 2 1   Restless 1 1 1  0  Easily annoyed or irritable 2 3 2 3   Afraid - awful might happen 1 1 0 2  Total GAD 7  Score 14 16 13 14   Anxiety Difficulty - Somewhat difficult - -

## 2018-07-13 ENCOUNTER — Encounter: Payer: Self-pay | Admitting: General Practice

## 2018-07-13 ENCOUNTER — Encounter: Payer: Self-pay | Admitting: Student

## 2018-07-13 ENCOUNTER — Ambulatory Visit (INDEPENDENT_AMBULATORY_CARE_PROVIDER_SITE_OTHER): Payer: Medicaid Other | Admitting: Student

## 2018-07-13 DIAGNOSIS — Z1389 Encounter for screening for other disorder: Secondary | ICD-10-CM | POA: Diagnosis not present

## 2018-07-13 DIAGNOSIS — Z30430 Encounter for insertion of intrauterine contraceptive device: Secondary | ICD-10-CM

## 2018-07-13 DIAGNOSIS — Z3202 Encounter for pregnancy test, result negative: Secondary | ICD-10-CM

## 2018-07-13 MED ORDER — ETONOGESTREL 68 MG ~~LOC~~ IMPL
68.0000 mg | DRUG_IMPLANT | Freq: Once | SUBCUTANEOUS | Status: AC
  Administered 2018-07-13: 68 mg via SUBCUTANEOUS

## 2018-07-13 NOTE — Progress Notes (Signed)
Subjective:     Kim Allen is a 22 y.o. female who presents for a postpartum visit. She is 6 weeks postpartum following a spontaneous vaginal delivery. I have fully reviewed the prenatal and intrapartum course. The delivery was at 39.5 gestational weeks. Outcome: spontaneous vaginal delivery. Anesthesia: epidural. Postpartum course has been Unremarkable. Baby's course has been Unremarkable. Baby is feeding by bottle - Lucien MonsGer[employer] Good Start Gentle. Bleeding staining only. Bowel function is normal. Bladder function is normal. Patient is not sexually active. Contraception method is Nexplanon. Postpartum depression screening: Positive. Patient had a 1st degree laceration. Denies issues or pain; states well healed.   The following portions of the patient's history were reviewed and updated as appropriate: allergies, current medications, past family history, past medical history, past social history, past surgical history and problem list.  Review of Systems Pertinent items are noted in HPI.   Objective:    BP 131/64   Pulse 62   Ht 5\' 7"  (1.702 m)   Wt 245 lb (111.1 kg)   BMI 38.37 kg/m   General:  alert, cooperative and appears stated age  Lungs: normal effort  Heart:  regular rate and rhythm, S1, S2 normal, no murmur, click, rub or gallop  Abdomen: soft & non tender        Assessment:   Last pap 04/2017 was LSIL. Needs pap smear. Patient doesn't want pap smear or pelvic exam today. Will bring back for exam.    NEXPLANON INSERTION: Appropriate time out taken. Nexlanon site (left arm) identified and the area was prepped in usual sterile fashon. 2 cc of 1% lidocaine was used to anesthetize the area starting with the distal end.   Next, the area was cleansed with betadine and the Nexplanon was inserted without difficulty.  Pressure bandage was applied.  Pt was instructed to remove pressure bandage in a few hours, and keep insertion site covered with a bandaid for 3 days.    Plan:    1.  Contraception: Nexplanon 2. Pt may return to work 3. Follow up in: 2 week for pap smear.    Kim Allen, Merdith Adan, NP

## 2018-07-13 NOTE — Patient Instructions (Signed)

## 2018-07-14 LAB — POCT PREGNANCY, URINE: Preg Test, Ur: NEGATIVE

## 2018-07-25 ENCOUNTER — Telehealth: Payer: Self-pay | Admitting: *Deleted

## 2018-07-25 NOTE — Telephone Encounter (Addendum)
Pt left message stating that she is trying to return to work. She states her papers have not been received by her job yet.   8/7  1224  Called pt and informed her that her FMLA papers have been completed and were faxed to her job yesterday. She is cleared for work wothout restrictions. Pt voiced understanding.

## 2018-08-02 ENCOUNTER — Other Ambulatory Visit (HOSPITAL_COMMUNITY)
Admission: RE | Admit: 2018-08-02 | Discharge: 2018-08-02 | Disposition: A | Discharge status: Home / Self Care | Payer: Medicaid Other | Source: Ambulatory Visit | Attending: Student | Admitting: Student

## 2018-08-02 ENCOUNTER — Ambulatory Visit: Payer: Medicaid Other | Admitting: Student

## 2018-08-02 ENCOUNTER — Encounter: Payer: Self-pay | Admitting: Student

## 2018-08-02 VITALS — BP 137/78 | HR 78 | Wt 243.0 lb

## 2018-08-02 DIAGNOSIS — Z09 Encounter for follow-up examination after completed treatment for conditions other than malignant neoplasm: Secondary | ICD-10-CM | POA: Diagnosis not present

## 2018-08-02 DIAGNOSIS — Z8742 Personal history of other diseases of the female genital tract: Secondary | ICD-10-CM | POA: Diagnosis not present

## 2018-08-02 DIAGNOSIS — R87612 Low grade squamous intraepithelial lesion on cytologic smear of cervix (LGSIL): Secondary | ICD-10-CM | POA: Diagnosis present

## 2018-08-02 NOTE — Progress Notes (Signed)
    Subjective:  Kim Allen is a 22 y.o. G1P1001 who is returning for a postpartum Pap. She was seen for her routine postpartum visit but was still having significant bleeding so preferred to wait until later. She returns today still with some minimal bleeding since placement of her Nexplanon.   Objective:   Vitals:   08/02/18 1012  BP: 137/78  Pulse: 78  Weight: 243 lb (110.2 kg)    General:  Alert, oriented and cooperative. Patient is in no acute distress.  Skin: Skin is warm and dry. No rash noted.   Cardiovascular: Normal heart rate noted  Respiratory: Normal respiratory effort, no problems with respiration noted  Abdomen: Soft, non-tender.     Pelvic: normal-appearing vaginal walls, small pooling blood noted in vault, normal-appearing cervix with mild ectropion         Extremities: Normal range of motion.     Mental Status: Normal mood and affect. Normal behavior. Normal judgment and thought content.   Assessment and Plan:  22yo G1P1001 recently postpartum here today for Pap smear.   2. LGSIL on Pap smear of cervix: Counseled on prior results and screening for cervical cancer. Patient unsure of HPV vaccination status. Encouraged to check pediatric records as still candidate for HPV vaccination.  - Cytology - PAP   No future appointments.  Tamera StandsLaurel S Kadasia Kassing, DO

## 2018-08-04 LAB — CYTOLOGY - PAP
Chlamydia: NEGATIVE
Diagnosis: NEGATIVE
Neisseria Gonorrhea: NEGATIVE

## 2018-12-23 IMAGING — US US MFM OB DETAIL+14 WK
1 series · 14 of 28 positions shown · non-contrast
Comparison: none

[Series 1: us mfm ob detail+14 wk · 14 of 78 slices shown]
[im 3/78]
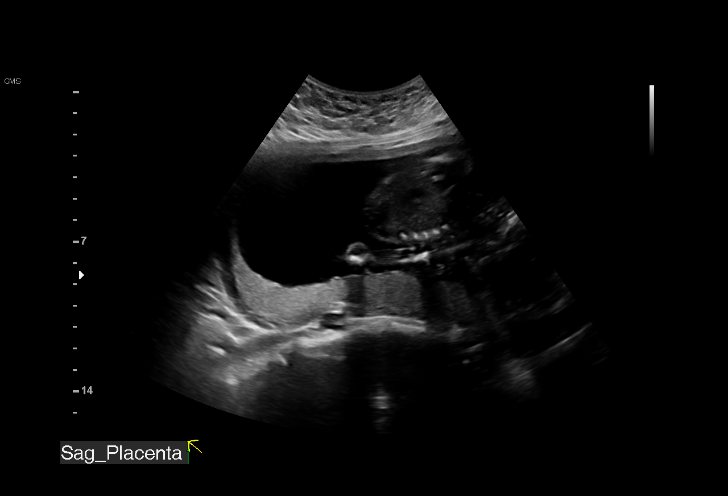
[im 9/78]
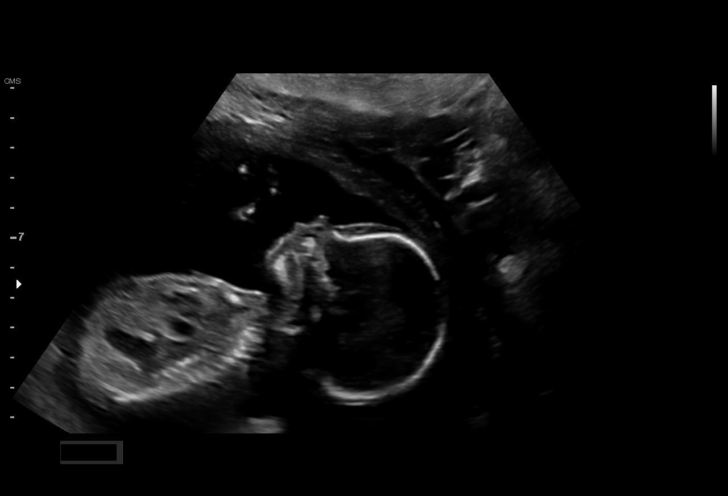
[im 15/78]
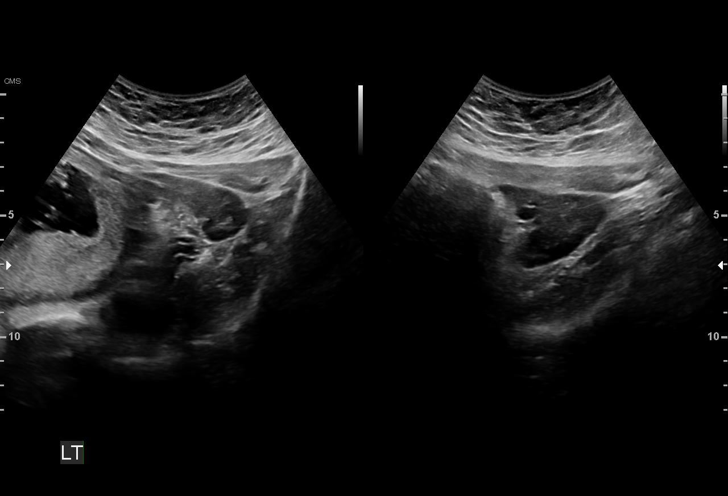
[im 20/78]
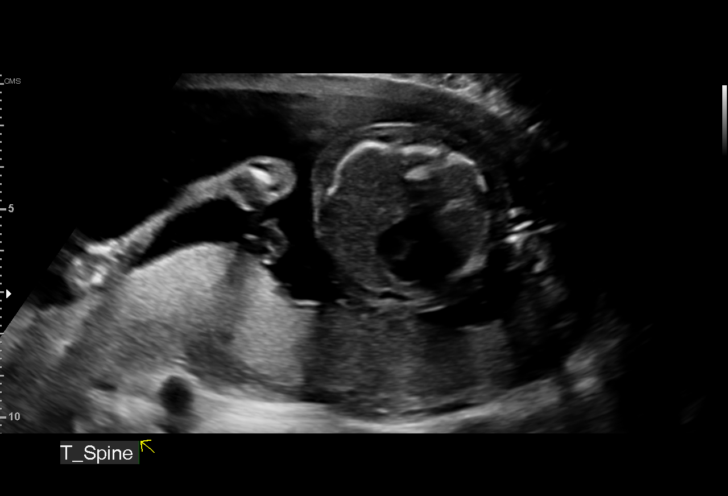
[im 26/78]
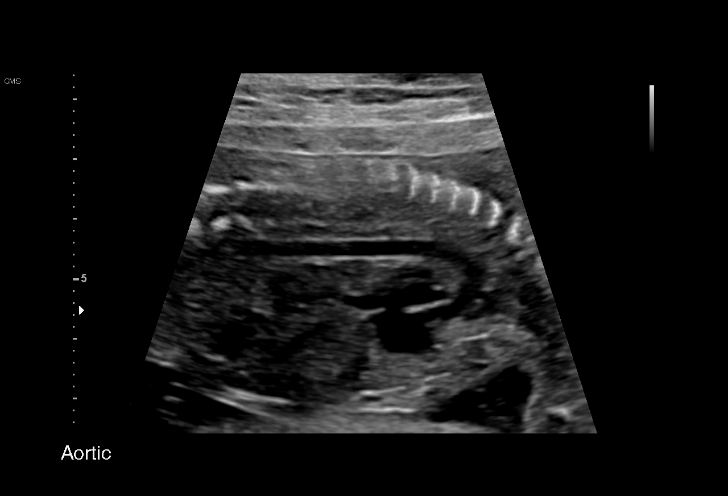
[im 32/78]
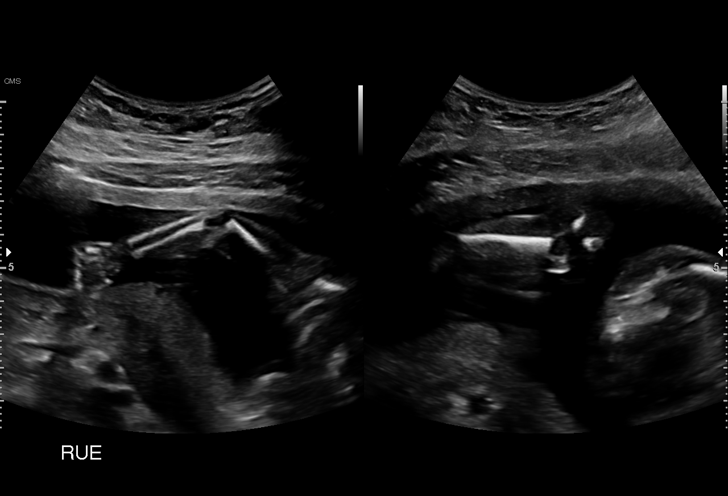
[im 38/78]
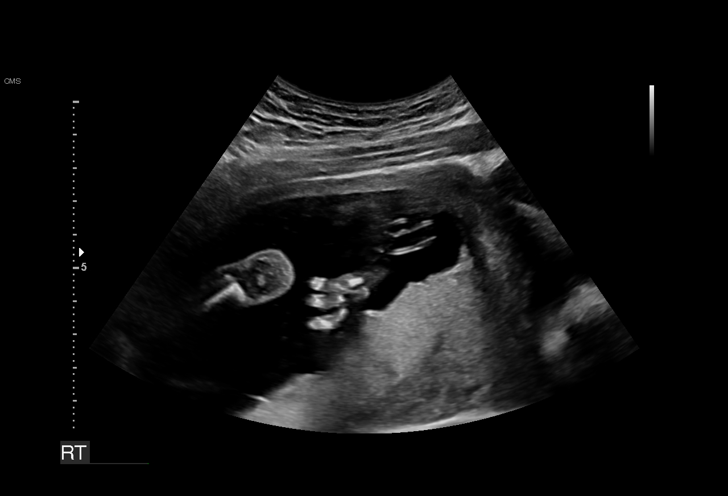
[im 43/78]
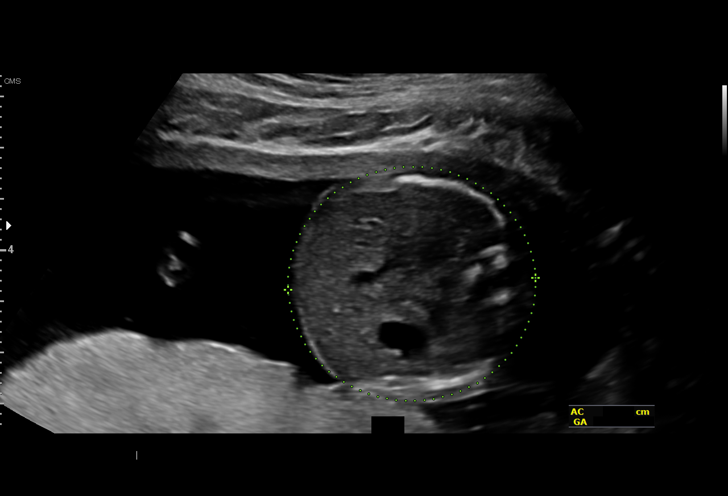
[im 49/78]
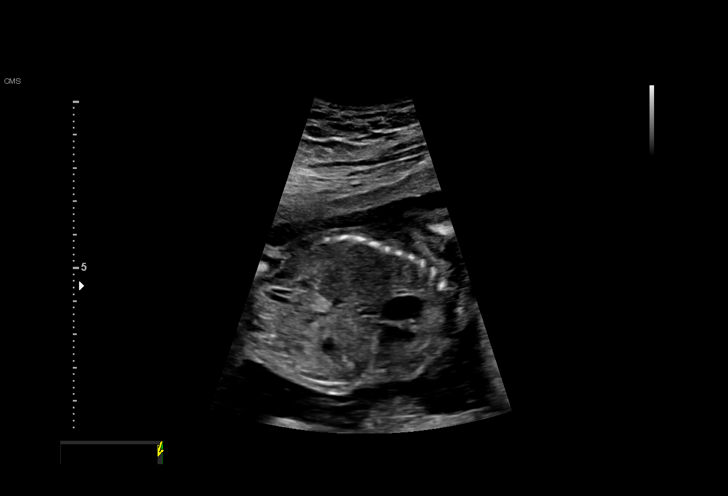
[im 55/78]
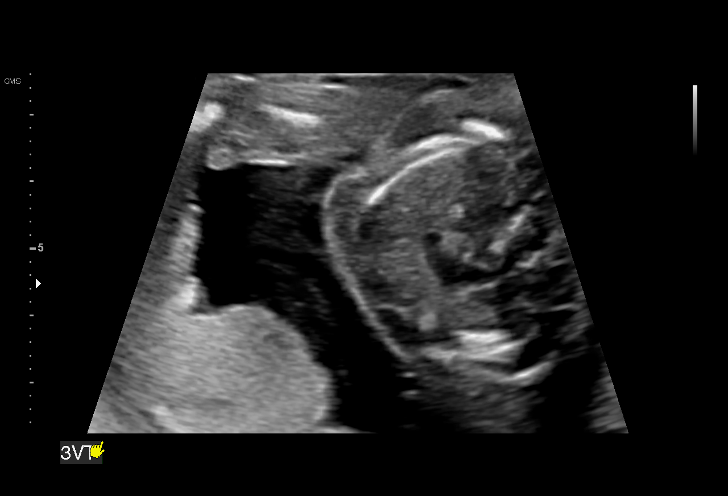
[im 60/78]
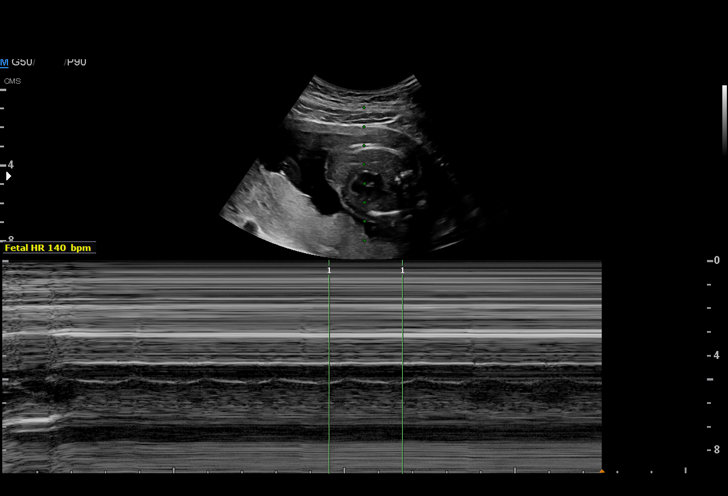
[im 66/78]
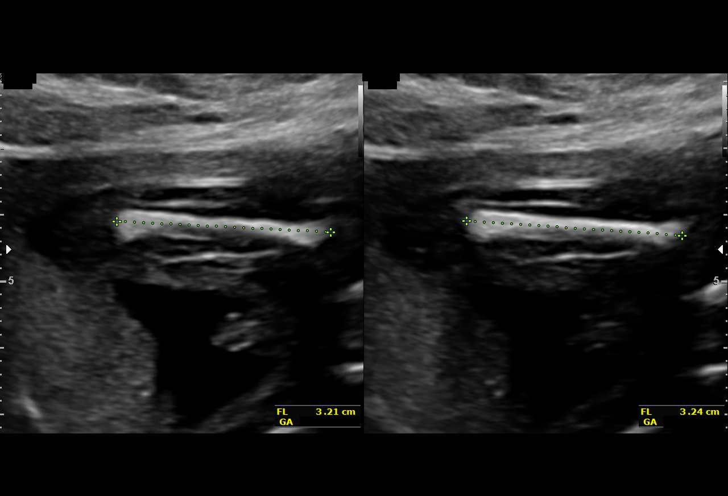
[im 72/78]
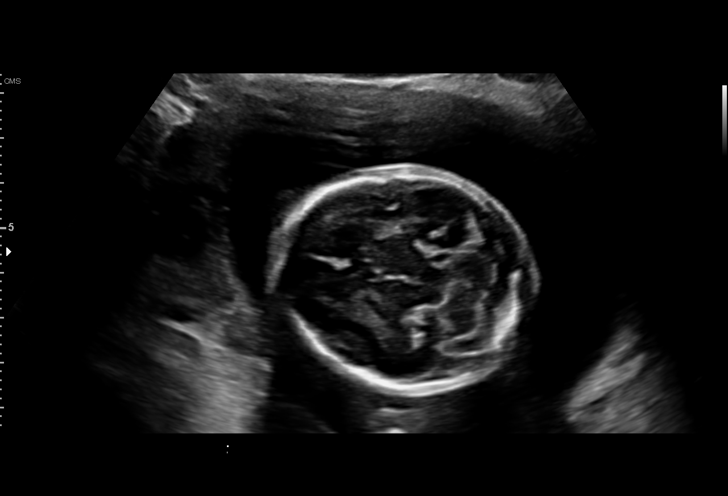
[im 78/78]
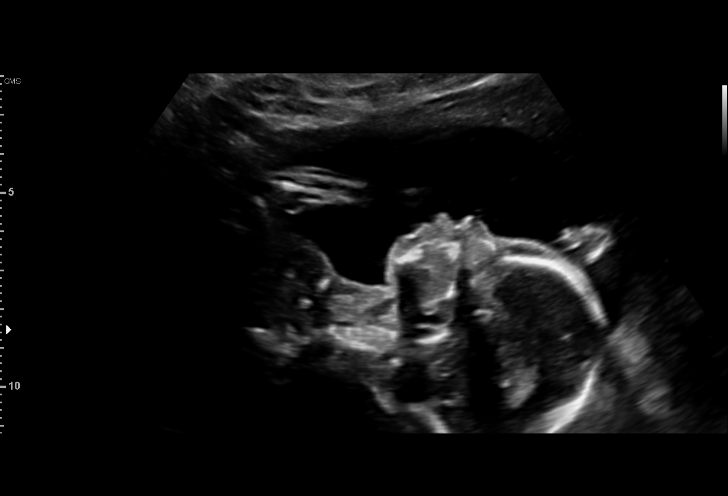

[14 of 28 positions shown; findings below may reference images not displayed]

1  JOHNY MAEDA              [id number]      [id number]     [id number]
Indications

19 weeks gestation of pregnancy
Encounter for fetal anatomic survey
Obesity complicating pregnancy, second
trimester (pregravid BMI 32)
OB History

Blood Type:            Height:  5'7"   Weight (lb):  205       BMI:
Gravidity:    1
Fetal Evaluation

Num Of Fetuses:     1
Fetal Heart         140
Rate(bpm):
Cardiac Activity:   Observed
Presentation:       Cephalic
Placenta:           Posterior, above cervical os
P. Cord Insertion:  Visualized

Amniotic Fluid
AFI FV:      Subjectively within normal limits

Largest Pocket(cm)
5.96
Biometry

BPD:      46.8  mm     G. Age:  20w 1d         90  %    CI:         74.5   %    70 - 86
FL/HC:      18.8   %    16.1 -
HC:      172.1  mm     G. Age:  19w 6d         78  %    HC/AC:      1.16        1.09 -
AC:      148.3  mm     G. Age:  20w 1d         79  %    FL/BPD:     69.0   %
FL:       32.3  mm     G. Age:  20w 1d         79  %    FL/AC:      21.8   %    20 - 24
HUM:        28  mm     G. Age:  19w 0d         50  %
CER:      20.6  mm     G. Age:  19w 4d         64  %
NFT:       4.8  mm

CM:        6.1  mm

Est. FW:     330  gm    0 lb 12 oz      61  %
Gestational Age

LMP:           19w 0d        Date:  09/03/17                 EDD:   06/10/18
U/S Today:     20w 1d                                        EDD:   06/02/18
Best:          19w 0d     Det. By:  Early Ultrasound         EDD:   06/10/18
(10/16/17)
Anatomy

Cranium:               Appears normal         Aortic Arch:            Appears normal
Cavum:                 Appears normal         Ductal Arch:            Appears normal
Ventricles:            Appears normal         Diaphragm:              Appears normal
Choroid Plexus:        Appears normal         Stomach:                Appears normal, left
sided
Cerebellum:            Appears normal         Abdomen:                Appears normal
Posterior Fossa:       Appears normal         Abdominal Wall:         Appears nml (cord
insert, abd wall)
Nuchal Fold:           Appears normal         Cord Vessels:           Appears normal (3
vessel cord)
Face:                  Appears normal         Kidneys:                Appear normal
(orbits and profile)
Lips:                  Appears normal         Bladder:                Appears normal
Thoracic:              Appears normal         Spine:                  Appears normal
Heart:                 Appears normal         Upper Extremities:      Appears normal
(4CH, axis, and
situs)
RVOT:                  Appears normal         Lower Extremities:      Appears normal
LVOT:                  Appears normal

Other:  Fetus appears to be a male. Heels and 5th digit visualized. Nasal
bone visualized.
Cervix Uterus Adnexa

Cervix
Length:            3.6  cm.
Normal appearance by transabdominal scan.

Uterus
No abnormality visualized.

Left Ovary
Within normal limits.

Right Ovary
Not visualized.

Cul De Sac:   No free fluid seen.

Adnexa:       No abnormality visualized.
Impression

Single living intrauterine pregnancy at 19 weeks 0 days.
Appropriate fetal growth (61%).
Normal amniotic fluid volume.
The fetal anatomic survey is complete.
Normal fetal anatomy.
No fetal anomalies or soft markers of aneuploidy seen.
Recommendations

Follow-up ultrasounds as clinically indicated.

## 2019-04-20 IMAGING — US US MFM OB FOLLOW-UP
1 series · 14 of 28 positions shown · non-contrast
Comparison: none

1  MUTSUMI INOHARA            [id number]      [id number]     [id number]
Indications

35 weeks gestation of pregnancy
Obesity complicating pregnancy, third
trimester
Encounter for other antenatal screening
follow-up
Uterine size-date discrepancy, third trimester
OB History
Blood Type:            Height:  5'7"   Weight (lb):  205       BMI:
Gravidity:    1
Fetal Evaluation
Num Of Fetuses:     1
Fetal Heart         132
Rate(bpm):
Cardiac Activity:   Observed
Presentation:       Cephalic
Placenta:           Posterior, above cervical os
P. Cord Insertion:  Previously Visualized
Amniotic Fluid
AFI FV:      Subjectively within normal limits
AFI Sum(cm)     %Tile       Largest Pocket(cm)
17.09           63
RUQ(cm)       RLQ(cm)       LUQ(cm)        LLQ(cm)
3.91
Biometry
BPD:      83.7  mm     G. Age:  33w 5d          7  %    CI:        65.19   %    70 - 86
FL/HC:      21.1   %    20.1 -
HC:       333   mm     G. Age:  38w 0d         72  %    HC/AC:      0.93        0.93 -
AC:      357.8  mm     G. Age:  39w 5d       > 97  %    FL/BPD:     84.1   %    71 - 87
FL:       70.4  mm     G. Age:  36w 1d         51  %    FL/AC:      19.7   %    20 - 24
HUM:      60.6  mm     G. Age:  35w 1d         49  %
Est. FW:    6636  gm      7 lb 6 oz   > 90  %
Gestational Age
LMP:           35w 6d        Date:  09/03/17                 EDD:   06/10/18
U/S Today:     36w 6d                                        EDD:   06/03/18
Best:          35w 6d     Det. By:  Early Ultrasound         EDD:   06/10/18
(10/16/17)
Anatomy
Cranium:               Appears normal         Aortic Arch:            Previously seen
Cavum:                 Previously seen        Ductal Arch:            Previously seen
Ventricles:            Appears normal         Diaphragm:              Previously seen
Choroid Plexus:        Previously seen        Stomach:                Appears normal, left
sided
Cerebellum:            Previously seen        Abdomen:                Appears normal
Posterior Fossa:       Previously seen        Abdominal Wall:         Previously seen
Nuchal Fold:           Previously seen        Cord Vessels:           Appears normal (3
vessel cord)
Face:                  Orbits and profile     Kidneys:                Appear normal
previously seen
Lips:                  Appears normal         Bladder:                Appears normal
Thoracic:              Appears normal         Spine:                  Previously seen
Heart:                 Appears normal         Upper Extremities:      Previously seen
(4CH, axis, and
situs)
RVOT:                  Previously seen        Lower Extremities:      Previously seen
LVOT:                  Previously seen
Other:  Fetus appears to be a male. Heels and 5th digit previously visualized.
Nasal bone visualized.
Cervix Uterus Adnexa
Cervix
Not visualized (advanced GA >37wks)
Uterus
No abnormality visualized.
Left Ovary
Not visualized.
Right Ovary
Cul De Sac:   No free fluid seen.
Adnexa:       No abnormality visualized.
Impression
INDICATION: 21 yr old G1P0 at 47w0d with obesity for fetal
growth secondary to size>dates. Remote read.

[Series 1: us mfm ob follow-up · 54 acquisitions, 14 frames shown]
[im 2/54]
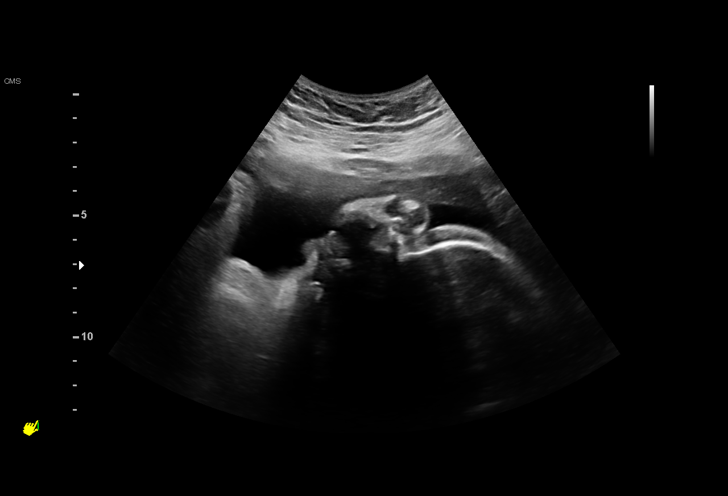
[im 6/54]
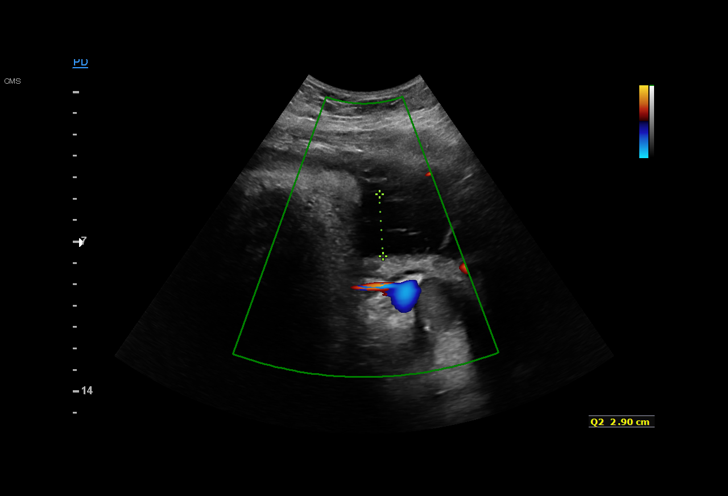
[im 10/54]
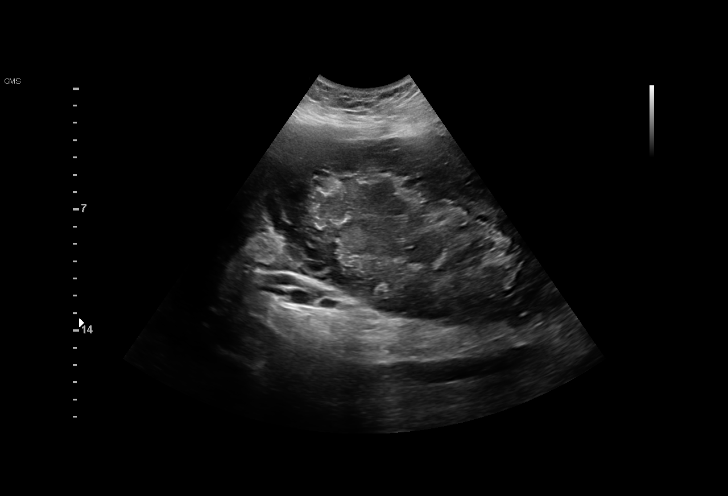
[im 14/54]
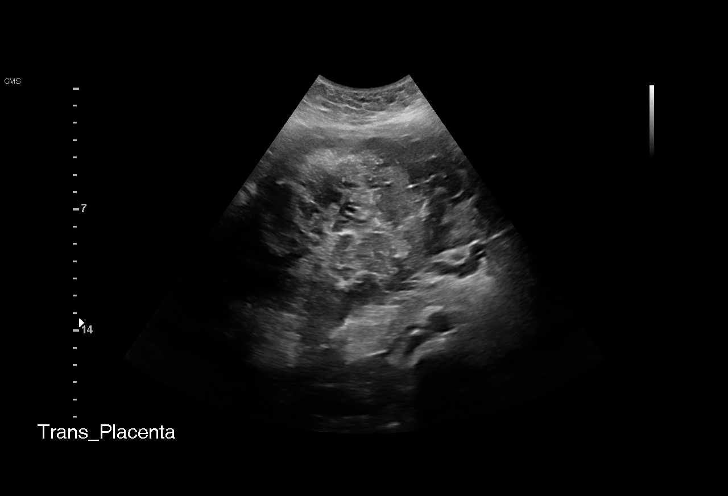
[im 18/54]
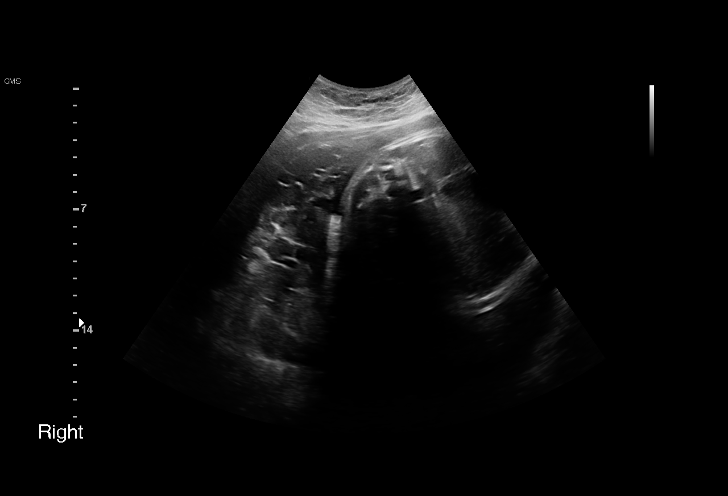
[im 22/54]
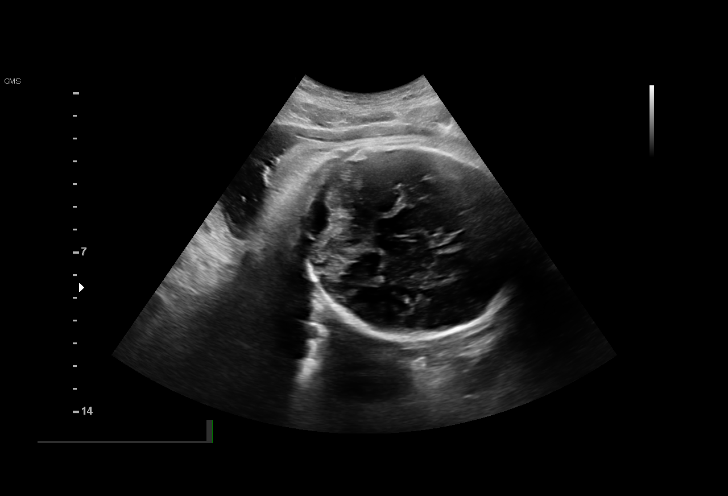
[im 26/54]
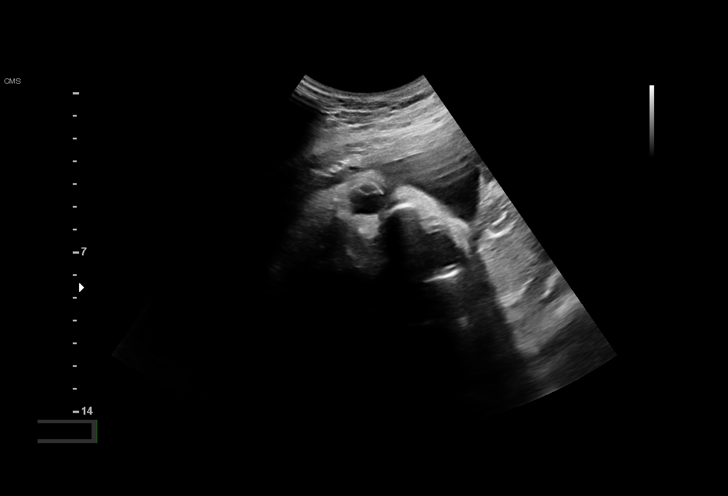
[im 30/54]
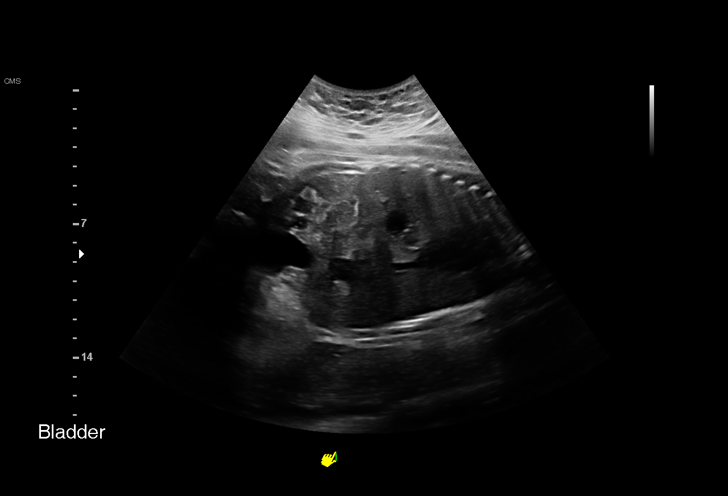
[im 34/54]
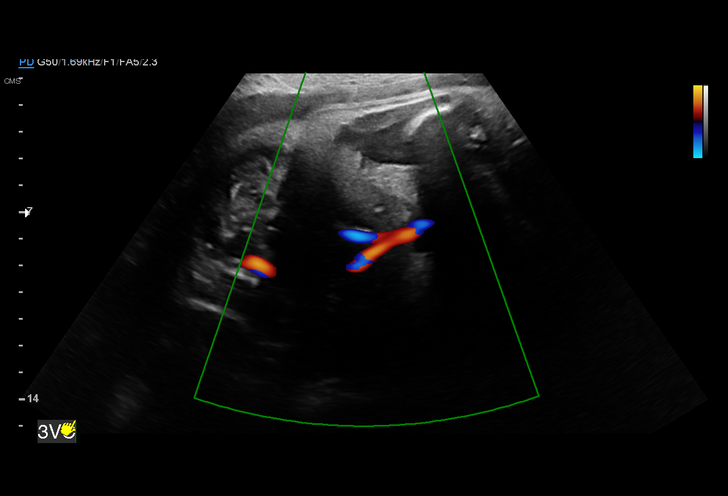
[im 38/54]
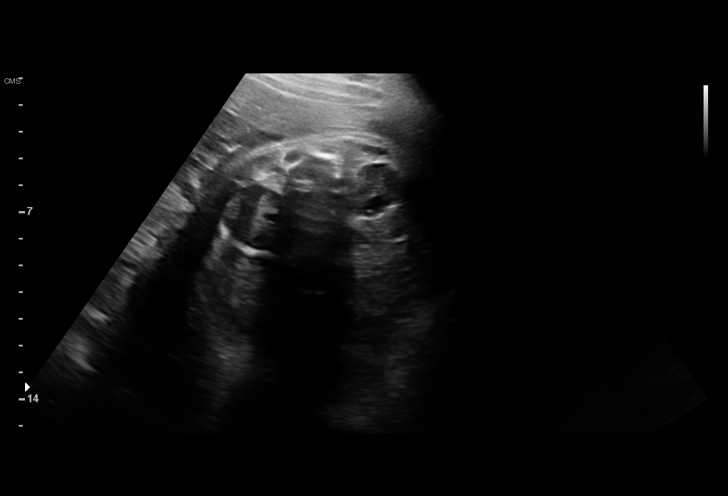
[im 42/54]
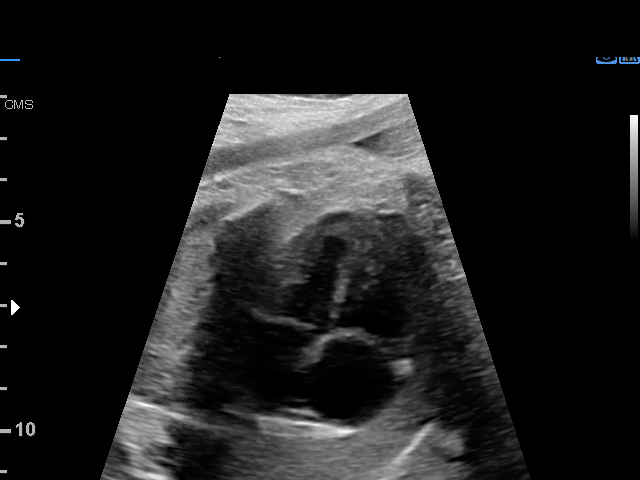
[im 46/54]
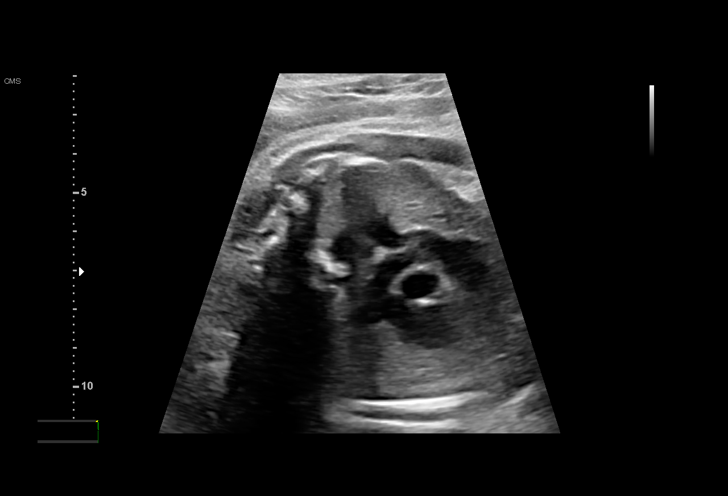
[im 50/54]
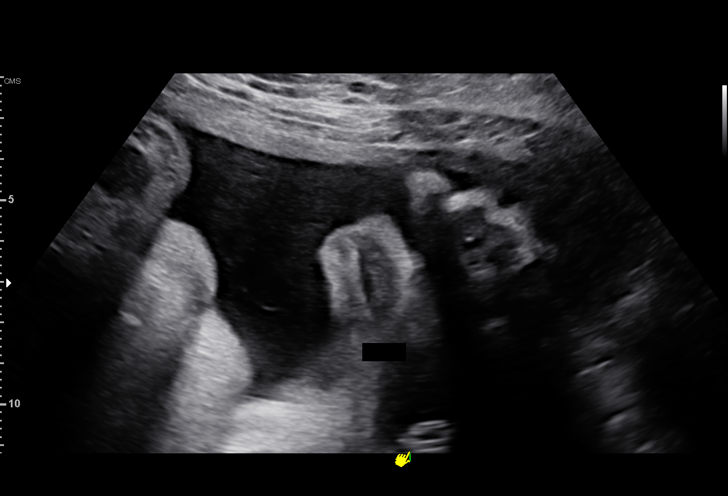
[im 54/54]
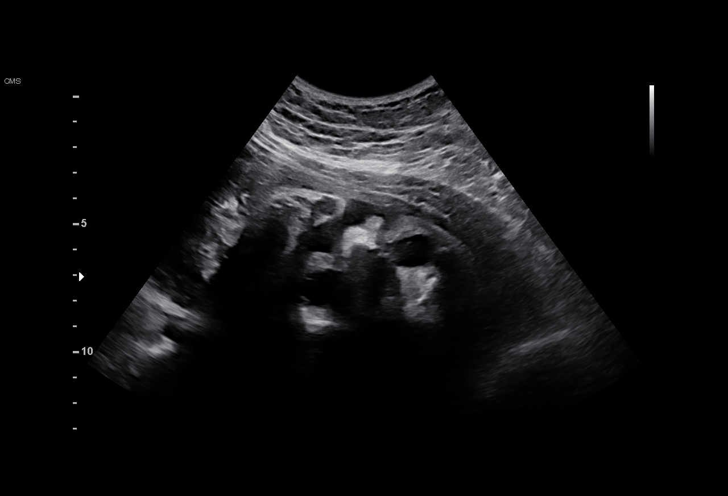

[14 of 28 positions shown; findings below may reference images not displayed]

FINDINGS: 1. Single intrauterine pregnancy with normal cardiac activity.
2. Estimated fetal weight is in the >90th%.
3. Posterior placenta without evidence of previa.
4. Normal amniotic fluid index.
5. The limited anatomy survey is normal.
Recommendations

1. Fetal macrosomia:
- normal 2 hr gtt
- if desired can repeat growth in 3-4 weeks to aid in delivery
planning
2. Normal first trimester screen and MSAFP

## 2020-05-24 ENCOUNTER — Ambulatory Visit (HOSPITAL_COMMUNITY)
Admission: RE | Admit: 2020-05-24 | Discharge: 2020-05-24 | Disposition: A | Discharge status: Home / Self Care | Payer: Self-pay | Attending: Psychiatry | Admitting: Psychiatry

## 2020-05-24 DIAGNOSIS — F329 Major depressive disorder, single episode, unspecified: Secondary | ICD-10-CM | POA: Insufficient documentation

## 2020-05-24 NOTE — H&P (Signed)
Behavioral Health Medical Screening Exam  Kim Allen is an 24 y.o. female due worsening depression.  Total Time spent with patient: 15 minutes  Psychiatric Specialty Exam: Physical Exam  Constitutional: She is oriented to person, place, and time. She appears well-developed and well-nourished. No distress.  HENT:  Head: Normocephalic and atraumatic.  Right Ear: External ear normal.  Left Ear: External ear normal.  Eyes: Pupils are equal, round, and reactive to light. Right eye exhibits no discharge. Left eye exhibits no discharge.  Respiratory: Effort normal. No respiratory distress.  Musculoskeletal:        General: Normal range of motion.  Neurological: She is alert and oriented to person, place, and time.  Skin: She is not diaphoretic.  Psychiatric: Her mood appears anxious. She is not withdrawn and not actively hallucinating. Thought content is not paranoid and not delusional. She exhibits a depressed mood. She expresses no homicidal and no suicidal ideation.   Review of Systems  Constitutional: Negative for activity change, appetite change, chills, diaphoresis, fatigue, fever and unexpected weight change.  HENT: Negative for congestion.   Respiratory: Negative for cough and shortness of breath.   Cardiovascular: Negative for chest pain and palpitations.  Gastrointestinal: Negative for diarrhea, nausea and vomiting.  Psychiatric/Behavioral: Positive for dysphoric mood, sleep disturbance and suicidal ideas. Negative for hallucinations and self-injury. The patient is nervous/anxious.   All other systems reviewed and are negative.  Blood pressure 127/82, pulse 86, temperature 99 F (37.2 C), temperature source Oral, resp. rate 20, unknown if currently breastfeeding.There is no height or weight on file to calculate BMI. General Appearance: Casual and Well Groomed Eye Contact:  Good Speech:  Clear and Coherent and Normal Rate Volume:  Normal Mood:  Anxious and Depressed Affect:   Congruent and Depressed Thought Process:  Coherent, Goal Directed, Linear and Descriptions of Associations: Intact Orientation:  Full (Time, Place, and Person) Thought Content:  Logical and Hallucinations: None Suicidal Thoughts:  No Homicidal Thoughts:  No Memory:  Immediate;   Good Recent;   Good Remote;   Good Judgement:  Fair Insight:  Fair Psychomotor Activity:  Normal Concentration: Concentration: Good and Attention Span: Good Recall:  Good Fund of Knowledge:Good Language: Good Akathisia:  Negative Handed:   AIMS (if indicated):    Assets:  Desire for Improvement Housing Leisure Time Physical Health Sleep:     Musculoskeletal: Strength & Muscle Tone: within normal limits Gait & Station: normal Patient leans: N/A  Blood pressure 127/82, pulse 86, temperature 99 F (37.2 C), temperature source Oral, resp. rate 20, unknown if currently breastfeeding.  Recommendations: Based on my evaluation the patient does not appear to have an emergency medical condition.   Disposition: No evidence of imminent risk to self or others at present.   Patient does not meet criteria for psychiatric inpatient admission. Supportive therapy provided about ongoing stressors. Discussed crisis plan, support from social network, calling 911, coming to the Emergency Department, and calling Suicide Hotline.   Denies current suicidal thoughts. Denies homicidal thoughts. Denies auditory and visual hallucinations. No indication that she is responding to internal stimuli. She is able to verbally contract for safety outside of the hospital. Discussed return precautions.  Jackelyn Poling, NP 05/24/2020, 11:47 PM
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
## 2020-08-04 ENCOUNTER — Encounter (HOSPITAL_COMMUNITY): Payer: Self-pay

## 2020-08-04 ENCOUNTER — Emergency Department (HOSPITAL_COMMUNITY)
Admission: EM | Admit: 2020-08-04 | Discharge: 2020-08-04 | Disposition: A | Discharge status: Home / Self Care | Payer: BC Managed Care – PPO | Attending: Emergency Medicine | Admitting: Emergency Medicine

## 2020-08-04 ENCOUNTER — Other Ambulatory Visit: Payer: Self-pay

## 2020-08-04 DIAGNOSIS — S61216A Laceration without foreign body of right little finger without damage to nail, initial encounter: Secondary | ICD-10-CM | POA: Insufficient documentation

## 2020-08-04 DIAGNOSIS — Y998 Other external cause status: Secondary | ICD-10-CM | POA: Diagnosis not present

## 2020-08-04 DIAGNOSIS — Y9389 Activity, other specified: Secondary | ICD-10-CM | POA: Diagnosis not present

## 2020-08-04 DIAGNOSIS — S61219A Laceration without foreign body of unspecified finger without damage to nail, initial encounter: Secondary | ICD-10-CM

## 2020-08-04 DIAGNOSIS — Y9289 Other specified places as the place of occurrence of the external cause: Secondary | ICD-10-CM | POA: Insufficient documentation

## 2020-08-04 DIAGNOSIS — W260XXA Contact with knife, initial encounter: Secondary | ICD-10-CM | POA: Insufficient documentation

## 2020-08-04 DIAGNOSIS — S61411A Laceration without foreign body of right hand, initial encounter: Secondary | ICD-10-CM | POA: Insufficient documentation

## 2020-08-04 NOTE — ED Triage Notes (Signed)
Patient here with right hand laceration that she reports happened yesterday am after cutting with knife, no bleeding, aNAD

## 2020-08-04 NOTE — ED Notes (Signed)
Patient verbalizes understanding of discharge instructions. Opportunity for questioning and answers were provided. Armband removed by staff, pt discharged from ED ambulatory.   

## 2020-08-04 NOTE — ED Provider Notes (Signed)
[facility] EMERGENCY DEPARTMENT Provider Note   CSN: [id number] Arrival date & time: 08/04/20  1227     History No chief complaint on file.   Kim Allen is a 24 y.o. female.  The history is provided by the patient.       Past Medical History:  Diagnosis Date  . Depression     Patient Active Problem List   Diagnosis Date Noted  . LGSIL on Pap smear of cervix 08/02/2018  . Severe major depression without psychotic features (HCC) 05/22/2017  . Cannabis use disorder, severe, dependence (HCC) 05/22/2017    Past Surgical History:  Procedure Laterality Date  . HERNIA REPAIR       OB History    Gravida  1   Para  1   Term  1   Preterm  0   AB  0   Living  1     SAB  0   TAB  0   Ectopic  0   Multiple  0   Live Births  1           Family History  Problem Relation Age of Onset  . Hypertension Mother   . Stroke Mother   . Hypertension Father   . Diabetes Maternal Aunt   . Diabetes Paternal Aunt   . Diabetes Paternal Uncle     Social History   Tobacco Use  . Smoking status: Never Smoker  . Smokeless tobacco: Never Used  Vaping Use  . Vaping Use: Never used  Substance Use Topics  . Alcohol use: Yes    Comment: Not since finding out pregnant  . Drug use: Yes    Types: Marijuana    Comment: Not since finding out pregnant    Home Medications Prior to Admission medications   Medication Sig Start Date End Date Taking? Authorizing Provider  FLUoxetine (PROZAC) 10 MG capsule Take 1 capsule (10 mg total) by mouth daily. 07/04/18   Armando Reichert, [name]  ibuprofen (ADVIL,MOTRIN) 600 MG tablet Take 1 tablet (600 mg total) by mouth every 6 (six) hours. 06/10/18   [name], MD  phenylephrine-shark liver oil-mineral oil-petrolatum (PREPARATION H) 0.25-3-14-71.9 % rectal ointment Place 1 application rectally 2 (two) times daily as needed for hemorrhoids.    [provider]  Prenatal Vit-Fe Fumarate-FA  (MULTIVITAMIN-PRENATAL) 27-0.8 MG TABS tablet Take 1 tablet by mouth daily at 12 noon.    [provider]    Allergies    Patient has no known allergies.  Review of Systems   Review of Systems  All other systems reviewed and are negative.   Physical Exam Updated Vital Signs BP 128/85   Pulse 79   Temp 99 F (37.2 C) (Oral)   Resp 16   SpO2 98%   Physical Exam Vitals and nursing note reviewed.  Constitutional:      General: She is not in acute distress.    Appearance: Normal appearance. She is normal weight.  HENT:     Head: Normocephalic.  Eyes:     Pupils: Pupils are equal, round, and reactive to light.  Cardiovascular:     Rate and Rhythm: Normal rate.     Pulses: Normal pulses.  Pulmonary:     Effort: Pulmonary effort is normal.  Musculoskeletal:     Right hand: Laceration and tenderness present. Decreased range of motion. Normal capillary refill. Normal pulse.       Hands:  Skin:    General: Skin  is warm and dry.  Neurological:     Mental Status: She is alert and oriented to person, place, and time.  Psychiatric:        Mood and Affect: Mood normal.        Behavior: Behavior normal.        Thought Content: Thought content normal.     ED Results / Procedures / Treatments   Labs (all labs ordered are listed, but only abnormal results are displayed) Labs Reviewed - No data to display  EKG None  Radiology No results found.  Procedures Procedures (including critical care time)  Medications Ordered in ED Medications - No data to display  ED Course  I have reviewed the triage vital signs and the nursing notes.  Pertinent labs & imaging results that were available during my care of the patient were reviewed by me and considered in my medical decision making (see chart for details).    MDM Rules/Calculators/A&P                          Patient with laceration to the hand 24 hours ago when she was cutting with a knife and the knife slipped  cutting her hand.  Patient reports she did not come to the ER because she cannot find anyone to take care of her child.  She does not know when her last tetanus shot was but based on records her last tetanus was in 2019.  Patient does have difficulty with flexion at the DIP joint of the the right fifth finger.  Other way she has full flexion extension of other thing fingers.  Given the length of time since injury would not elect to repair the laceration and will allow to heal by secondary intention.  Due to concern for possible flexor tendon issue of the fifth digit patient was given follow-up with hand surgery for further evaluation.  She is vascularly intact at this time.  Wounds cleaned and follow-up given.  Final Clinical Impression(s) / ED Diagnoses Final diagnoses:  Laceration of finger of right hand with complication, initial encounter    Rx / DC Orders ED Discharge Orders    None       Gwyneth Sprout, MD 08/04/20 1537

## 2020-08-04 NOTE — Discharge Instructions (Signed)
Just place vasoline and a bandage over the wounds and wash with soap and water.  Because you can't bend your finger you need to follow up with the specialist by calling tomorrow.

## 2020-08-08 ENCOUNTER — Other Ambulatory Visit: Payer: Self-pay | Admitting: Orthopedic Surgery

## 2020-08-08 ENCOUNTER — Other Ambulatory Visit: Payer: Self-pay

## 2020-08-08 ENCOUNTER — Encounter (HOSPITAL_BASED_OUTPATIENT_CLINIC_OR_DEPARTMENT_OTHER): Payer: Self-pay | Admitting: Orthopedic Surgery

## 2020-08-09 ENCOUNTER — Other Ambulatory Visit (HOSPITAL_COMMUNITY): Payer: BC Managed Care – PPO

## 2020-08-10 ENCOUNTER — Other Ambulatory Visit (HOSPITAL_COMMUNITY)
Admission: RE | Admit: 2020-08-10 | Discharge: 2020-08-10 | Disposition: A | Discharge status: Home / Self Care | Payer: BC Managed Care – PPO | Source: Ambulatory Visit | Attending: Orthopedic Surgery | Admitting: Orthopedic Surgery

## 2020-08-10 DIAGNOSIS — Z01812 Encounter for preprocedural laboratory examination: Secondary | ICD-10-CM | POA: Diagnosis not present

## 2020-08-10 DIAGNOSIS — Z20822 Contact with and (suspected) exposure to covid-19: Secondary | ICD-10-CM | POA: Insufficient documentation

## 2020-08-10 LAB — SARS CORONAVIRUS 2 (TAT 6-24 HRS): SARS Coronavirus 2: NEGATIVE

## 2020-08-13 ENCOUNTER — Ambulatory Visit (HOSPITAL_BASED_OUTPATIENT_CLINIC_OR_DEPARTMENT_OTHER): Payer: BC Managed Care – PPO | Admitting: Anesthesiology

## 2020-08-13 ENCOUNTER — Ambulatory Visit (HOSPITAL_BASED_OUTPATIENT_CLINIC_OR_DEPARTMENT_OTHER)
Admission: RE | Admit: 2020-08-13 | Discharge: 2020-08-13 | Disposition: A | Discharge status: Home / Self Care | Payer: BC Managed Care – PPO | Attending: Orthopedic Surgery | Admitting: Orthopedic Surgery

## 2020-08-13 ENCOUNTER — Encounter (HOSPITAL_BASED_OUTPATIENT_CLINIC_OR_DEPARTMENT_OTHER)
Admission: RE | Disposition: A | Discharge status: Home / Self Care | Payer: Self-pay | Source: Home / Self Care | Attending: Orthopedic Surgery

## 2020-08-13 ENCOUNTER — Other Ambulatory Visit: Payer: Self-pay

## 2020-08-13 ENCOUNTER — Encounter (HOSPITAL_BASED_OUTPATIENT_CLINIC_OR_DEPARTMENT_OTHER): Payer: Self-pay | Admitting: Orthopedic Surgery

## 2020-08-13 DIAGNOSIS — Z823 Family history of stroke: Secondary | ICD-10-CM | POA: Diagnosis not present

## 2020-08-13 DIAGNOSIS — F4310 Post-traumatic stress disorder, unspecified: Secondary | ICD-10-CM | POA: Insufficient documentation

## 2020-08-13 DIAGNOSIS — Z8249 Family history of ischemic heart disease and other diseases of the circulatory system: Secondary | ICD-10-CM | POA: Insufficient documentation

## 2020-08-13 DIAGNOSIS — F329 Major depressive disorder, single episode, unspecified: Secondary | ICD-10-CM | POA: Insufficient documentation

## 2020-08-13 DIAGNOSIS — S61212A Laceration without foreign body of right middle finger without damage to nail, initial encounter: Secondary | ICD-10-CM | POA: Insufficient documentation

## 2020-08-13 DIAGNOSIS — S66126A Laceration of flexor muscle, fascia and tendon of right little finger at wrist and hand level, initial encounter: Secondary | ICD-10-CM | POA: Insufficient documentation

## 2020-08-13 DIAGNOSIS — W260XXA Contact with knife, initial encounter: Secondary | ICD-10-CM | POA: Diagnosis not present

## 2020-08-13 DIAGNOSIS — F419 Anxiety disorder, unspecified: Secondary | ICD-10-CM | POA: Diagnosis not present

## 2020-08-13 DIAGNOSIS — Z833 Family history of diabetes mellitus: Secondary | ICD-10-CM | POA: Insufficient documentation

## 2020-08-13 HISTORY — DX: Anxiety disorder, unspecified: F41.9

## 2020-08-13 HISTORY — DX: Post-traumatic stress disorder, unspecified: F43.10

## 2020-08-13 HISTORY — PX: TENDON REPAIR: SHX5111

## 2020-08-13 LAB — POCT PREGNANCY, URINE: Preg Test, Ur: NEGATIVE

## 2020-08-13 SURGERY — TENDON REPAIR
Anesthesia: Monitor Anesthesia Care | Site: Hand | Laterality: Right

## 2020-08-13 MED ORDER — CEFAZOLIN SODIUM-DEXTROSE 2-4 GM/100ML-% IV SOLN
INTRAVENOUS | Status: AC
  Filled 2020-08-13: qty 100

## 2020-08-13 MED ORDER — MIDAZOLAM HCL 2 MG/2ML IJ SOLN
INTRAMUSCULAR | Status: AC
  Filled 2020-08-13: qty 2

## 2020-08-13 MED ORDER — ACETAMINOPHEN 500 MG PO TABS
1000.0000 mg | ORAL_TABLET | Freq: Once | ORAL | Status: AC
  Administered 2020-08-13: 1000 mg via ORAL

## 2020-08-13 MED ORDER — MIDAZOLAM HCL 2 MG/2ML IJ SOLN
2.0000 mg | Freq: Once | INTRAMUSCULAR | Status: AC
  Administered 2020-08-13: 2 mg via INTRAVENOUS

## 2020-08-13 MED ORDER — TRAMADOL HCL 50 MG PO TABS: 50.0000 mg | ORAL_TABLET | Freq: Four times a day (QID) | ORAL | 0 refills | Status: AC | PRN

## 2020-08-13 MED ORDER — PROPOFOL 500 MG/50ML IV EMUL
INTRAVENOUS | Status: DC | PRN
  Administered 2020-08-13: 100 ug/kg/min via INTRAVENOUS

## 2020-08-13 MED ORDER — 0.9 % SODIUM CHLORIDE (POUR BTL) OPTIME
TOPICAL | Status: DC | PRN
  Administered 2020-08-13: 100 mL

## 2020-08-13 MED ORDER — THROMBIN 5000 UNITS EX SOLR
CUTANEOUS | Status: AC
  Filled 2020-08-13: qty 5000

## 2020-08-13 MED ORDER — CEFAZOLIN SODIUM-DEXTROSE 2-4 GM/100ML-% IV SOLN
2.0000 g | INTRAVENOUS | Status: AC
  Administered 2020-08-13: 2 g via INTRAVENOUS

## 2020-08-13 MED ORDER — HEPARIN SODIUM (PORCINE) 1000 UNIT/ML IJ SOLN
INTRAMUSCULAR | Status: AC
  Filled 2020-08-13: qty 1

## 2020-08-13 MED ORDER — LACTATED RINGERS IV SOLN: INTRAVENOUS | Status: DC

## 2020-08-13 MED ORDER — ACETAMINOPHEN 500 MG PO TABS
ORAL_TABLET | ORAL | Status: AC
  Filled 2020-08-13: qty 2

## 2020-08-13 MED ORDER — ONDANSETRON HCL 4 MG/2ML IJ SOLN
INTRAMUSCULAR | Status: DC | PRN
  Administered 2020-08-13: 4 mg via INTRAVENOUS

## 2020-08-13 MED ORDER — LIDOCAINE HCL (PF) 1 % IJ SOLN
INTRAMUSCULAR | Status: AC
  Filled 2020-08-13: qty 30

## 2020-08-13 MED ORDER — FENTANYL CITRATE (PF) 100 MCG/2ML IJ SOLN
100.0000 ug | Freq: Once | INTRAMUSCULAR | Status: AC
  Administered 2020-08-13: 100 ug via INTRAVENOUS

## 2020-08-13 MED ORDER — FENTANYL CITRATE (PF) 100 MCG/2ML IJ SOLN
INTRAMUSCULAR | Status: AC
  Filled 2020-08-13: qty 2

## 2020-08-13 MED ORDER — FENTANYL CITRATE (PF) 100 MCG/2ML IJ SOLN: 25.0000 ug | INTRAMUSCULAR | Status: DC | PRN

## 2020-08-13 MED ORDER — LIDOCAINE 2% (20 MG/ML) 5 ML SYRINGE
INTRAMUSCULAR | Status: DC | PRN
  Administered 2020-08-13: 30 mg via INTRAVENOUS

## 2020-08-13 SURGICAL SUPPLY — 82 items
APL PRP STRL LF DISP 70% ISPRP (MISCELLANEOUS) ×2
BAG DECANTER FOR FLEXI CONT (MISCELLANEOUS) IMPLANT
BLADE MINI RND TIP GREEN BEAV (BLADE) IMPLANT
BLADE SURG 15 STRL LF DISP TIS (BLADE) ×2 IMPLANT
BLADE SURG 15 STRL SS (BLADE) ×4
BNDG CMPR 9X4 STRL LF SNTH (GAUZE/BANDAGES/DRESSINGS) ×2
BNDG COHESIVE 3X5 TAN STRL LF (GAUZE/BANDAGES/DRESSINGS) ×4 IMPLANT
BNDG ESMARK 4X9 LF (GAUZE/BANDAGES/DRESSINGS) ×4 IMPLANT
BNDG GAUZE ELAST 4 BULKY (GAUZE/BANDAGES/DRESSINGS) ×4 IMPLANT
CHLORAPREP W/TINT 26 (MISCELLANEOUS) ×4 IMPLANT
CORD BIPOLAR FORCEPS 12FT (ELECTRODE) ×4 IMPLANT
COVER BACK TABLE 60X90IN (DRAPES) ×4 IMPLANT
COVER MAYO STAND STRL (DRAPES) ×4 IMPLANT
COVER WAND RF STERILE (DRAPES) IMPLANT
CUFF TOURN SGL QUICK 18X4 (TOURNIQUET CUFF) ×4 IMPLANT
DECANTER SPIKE VIAL GLASS SM (MISCELLANEOUS) ×4 IMPLANT
DRAIN TLS ROUND 10FR (DRAIN) IMPLANT
DRAPE EXTREMITY T 121X128X90 (DISPOSABLE) ×4 IMPLANT
DRAPE SURG 17X23 STRL (DRAPES) ×4 IMPLANT
DRSG PAD ABDOMINAL 8X10 ST (GAUZE/BANDAGES/DRESSINGS) IMPLANT
GAUZE 4X4 16PLY RFD (DISPOSABLE) IMPLANT
GAUZE SPONGE 4X4 12PLY STRL (GAUZE/BANDAGES/DRESSINGS) ×4 IMPLANT
GAUZE XEROFORM 1X8 LF (GAUZE/BANDAGES/DRESSINGS) ×4 IMPLANT
GLOVE BIO SURGEON STRL SZ 6.5 (GLOVE) ×3 IMPLANT
GLOVE BIO SURGEON STRL SZ7.5 (GLOVE) ×4 IMPLANT
GLOVE BIO SURGEONS STRL SZ 6.5 (GLOVE) ×1
GLOVE BIOGEL PI IND STRL 7.0 (GLOVE) ×6 IMPLANT
GLOVE BIOGEL PI IND STRL 8 (GLOVE) ×2 IMPLANT
GLOVE BIOGEL PI IND STRL 8.5 (GLOVE) ×2 IMPLANT
GLOVE BIOGEL PI INDICATOR 7.0 (GLOVE) ×6
GLOVE BIOGEL PI INDICATOR 8 (GLOVE) ×2
GLOVE BIOGEL PI INDICATOR 8.5 (GLOVE) ×2
GLOVE ECLIPSE 6.5 STRL STRAW (GLOVE) ×4 IMPLANT
GLOVE SURG ORTHO 8.0 STRL STRW (GLOVE) ×4 IMPLANT
GOWN STRL REUS W/ TWL LRG LVL3 (GOWN DISPOSABLE) ×4 IMPLANT
GOWN STRL REUS W/TWL LRG LVL3 (GOWN DISPOSABLE) ×8
GOWN STRL REUS W/TWL XL LVL3 (GOWN DISPOSABLE) ×8 IMPLANT
LOOP VESSEL MAXI BLUE (MISCELLANEOUS) IMPLANT
NDL SAFETY ECLIPSE 18X1.5 (NEEDLE) IMPLANT
NEEDLE HYPO 18GX1.5 SHARP (NEEDLE)
NEEDLE HYPO 25X1 1.5 SAFETY (NEEDLE) ×4 IMPLANT
NEEDLE KEITH (NEEDLE) IMPLANT
NEEDLE PRECISIONGLIDE 27X1.5 (NEEDLE) IMPLANT
NS IRRIG 1000ML POUR BTL (IV SOLUTION) ×4 IMPLANT
PACK BASIN DAY SURGERY FS (CUSTOM PROCEDURE TRAY) ×8 IMPLANT
PAD CAST 3X4 CTTN HI CHSV (CAST SUPPLIES) IMPLANT
PAD CAST 4YDX4 CTTN HI CHSV (CAST SUPPLIES) ×2 IMPLANT
PADDING CAST ABS 3INX4YD NS (CAST SUPPLIES)
PADDING CAST ABS COTTON 3X4 (CAST SUPPLIES) IMPLANT
PADDING CAST COTTON 3X4 STRL (CAST SUPPLIES)
PADDING CAST COTTON 4X4 STRL (CAST SUPPLIES) ×4
SLEEVE SCD COMPRESS KNEE MED (MISCELLANEOUS) ×4 IMPLANT
SLING ARM FOAM STRAP LRG (SOFTGOODS) ×4 IMPLANT
SPEAR EYE SURG WECK-CEL (MISCELLANEOUS) ×4 IMPLANT
SPLINT PLASTER CAST XFAST 3X15 (CAST SUPPLIES) ×16 IMPLANT
SPLINT PLASTER XTRA FASTSET 3X (CAST SUPPLIES) ×16
STOCKINETTE 4X48 STRL (DRAPES) ×4 IMPLANT
SUT CHROMIC 5 0 P 3 (SUTURE) IMPLANT
SUT ETHIBOND 3-0 V-5 (SUTURE) IMPLANT
SUT ETHILON 3 0 PS 1 (SUTURE) IMPLANT
SUT ETHILON 4 0 PS 2 18 (SUTURE) ×4 IMPLANT
SUT FIBERWIRE 4-0 18 DIAM BLUE (SUTURE)
SUT MERSILENE 2.0 SH NDLE (SUTURE) IMPLANT
SUT MERSILENE 4 0 P 3 (SUTURE) IMPLANT
SUT MERSILENE 6-0 18IN S14 8MM (SUTURE) ×4
SUT NYLON 9 0 VRM6 (SUTURE) IMPLANT
SUT PROLENE 2 0 SH DA (SUTURE) IMPLANT
SUT PROLENE 5 0 P 3 (SUTURE) IMPLANT
SUT SILK 4 0 PS 2 (SUTURE) ×4 IMPLANT
SUT SUPRAMID 3-0 (SUTURE) IMPLANT
SUT SUPRAMID 4-0 (SUTURE) ×4 IMPLANT
SUT VIC AB 4-0 P-3 18XBRD (SUTURE) IMPLANT
SUT VIC AB 4-0 P3 18 (SUTURE)
SUT VICRYL 4-0 PS2 18IN ABS (SUTURE) IMPLANT
SUTURE FIBERWR 4-0 18 DIA BLUE (SUTURE) IMPLANT
SUTURE MERSLN 6-0 18IN S14 8MM (SUTURE) ×2 IMPLANT
SYR BULB EAR ULCER 3OZ GRN STR (SYRINGE) ×4 IMPLANT
SYR CONTROL 10ML LL (SYRINGE) IMPLANT
SYR TOOMEY 50ML (SYRINGE) IMPLANT
TOWEL GREEN STERILE FF (TOWEL DISPOSABLE) ×4 IMPLANT
TUBE FEEDING ENTERAL 5FR 16IN (TUBING) IMPLANT
UNDERPAD 30X36 HEAVY ABSORB (UNDERPADS AND DIAPERS) ×4 IMPLANT

## 2020-08-13 NOTE — Op Note (Signed)
NAME: Kim Allen MEDICAL RECORD NO: [id number] DATE OF BIRTH: 08-24-1996 FACILITY: [facility] LOCATION: [facility] PHYSICIAN: Nicki Reaper, MD   OPERATIVE REPORT   DATE OF PROCEDURE: 08/13/20    PREOPERATIVE DIAGNOSIS:   Laceration right middle right small fingers  POSTOPERATIVE DIAGNOSIS:  same with repair of flexor tendons small finger      PROCEDURE:   Exploration  artery tendon nerves middle and small fingers right hand with repair of superficialis profundus tendon small finger   SURGEON: Cindee Salt, M.D.   ASSISTANT: Betha Loa, MD   ANESTHESIA:  Regional with sedation   INTRAVENOUS FLUIDS:  Per anesthesia flow sheet.   ESTIMATED BLOOD LOSS:  Minimal.   COMPLICATIONS:  None.   SPECIMENS:  none   TOURNIQUET TIME:    Total Tourniquet Time Documented: Upper Arm (Right) - 47 minutes Total: Upper Arm (Right) - 47 minutes    DISPOSITION:  Stable to PACU.   INDICATIONS: Patient is a 24 year old female who sustained a laceration to her right hand with a small laceration in the first webspace a laceration over the middle and small fingers with inability to flex her small finger.  She is admitted for exploration of the laceration on her middle and small fingers with possible artery nerve tendon repair as dictated by findings.  Pre-peripostoperative course been discussed along with risks and complications.  She is aware there is no guarantee to the surgery the possibility of infection recurrence injury to arteries nerves tendons complete relief symptoms dystrophy.  Preoperative area the patient seen extremity marked by both patient and surgeon antibiotic given a supraclavicular block was carried out without difficulty under the direction of the anesthesia department.  OPERATIVE COURSE: Patient brought to the operating room placed in the supine position prepped with ChloraPrep a 3-minute dry time was allowed timeout taken to confirm patient procedure.  The small  area on the first webspace had not entirely sealed and cultures were taken for both aerobic anaerobic cultures.  This was then sealed with an OpSite.  The limb was exsanguinated with an [facility] bandage turn placed on the arm was inflated to 250 mmHg.  The laceration on the middle finger was then opened extended proximally distally allowing visualization of the flexor tendons artery and nerve both radially and ulnarly all of which were intact.  The wound was irrigated and closed interrupted 4-0 nylon sutures.  The wound on the small finger was attended to next.  The opening was then made where the laceration was extended proximally distally in a Bruner type incision.  This carried down through subcutaneous tissue.  The neurovascular bundles radially and ulnarly were isolated identified and found to be intact over the entire course.  A laceration was then immediately apparent at the level of the A3 pulley and the flexor sheath with blood and flexor sheath.  With opening at the sheath from a 2-day for the profundus tendon was found to be fully lacerated and superficialis partially lacerated.  The superficialis laceration was then repaired and one limb using a modified Kessler stitch of 6-0 Mersilene sutures.  The profundus tendon proximally was then able to be secured with a hemostat brought into the wound and pinned with a 2 25-gauge needle.  The distal stump was at the level of the A4 pulley.  The A4 pulley was partially relieved on its ulnar aspect.  This allowed the tendon to be brought into the wound through the area of the vincula.  The repair  was then performed with 4-0 Supramid sutures placing this and not tying it allowing the Epstein suture of 6-0 Mersilene to be placed fixing the back wall is much as possible.  The cord suture was then tied securing the 2 ends of the tendon together and the remainder of the upper T9 suture was then placed.  There was some difficulty gliding distally and a small for the  portion of the A4 pulley was then incised on its ulnar aspect allowing the finger to be fully extended.  With flexion it did not gauge on the A2 pulley.  The wound was copious irrigated with saline.  The skin was then closed with 4-0 nylon sutures.  A sterile compressive dressing dorsal splint with the wrist flexed fingers in flexed position was applied.  Deflation of the tourniquet all fingers immediately pink.  She was taken to the recovery room for observation in satisfactory condition.  She will be discharged home return to the hand center of [facility] in 1 week on Tylenol for with ibuprofen for pain and Ultram for breakthrough.   Cindee Salt, MD Electronically signed, 08/13/20

## 2020-08-13 NOTE — H&P (Signed)
  Kim Allen is an 24 y.o. female.   Chief Complaint: lacerations right hand HPI: Kim Allen 24 year old right-hand-dominant female sustained lacerations to her middle and small fingers right hand with a knife probe. He was attempting to cut self the injury occurred on Saturday a.m. she was seen Sunday a.m. in the emergency room. The wounds were not closed. She has not complaining of any pain swelling at the present time. She is unable to flex her small finger. She is not complaining of numbness or tingling in any of the digit. She has no prior history of injury. She is up-to-date on her tetanus. She unfortunately is not put on any antibiotic. She has a laceration in the mid palm thenar eminence area she has no history of diabetes thyroid problems arthritis or gout. Family history is positive diabetes negative for the remainder.     Past Medical History:  Diagnosis Date  . Anxiety   . Depression   . PTSD (post-traumatic stress disorder)     Past Surgical History:  Procedure Laterality Date  . HERNIA REPAIR  2001    Family History  Problem Relation Age of Onset  . Hypertension Mother   . Stroke Mother   . Hypertension Father   . Diabetes Maternal Aunt   . Diabetes Paternal Aunt   . Diabetes Paternal Uncle    Social History:  reports that she has never smoked. She has never used smokeless tobacco. She reports current alcohol use. She reports current drug use. Drug: Marijuana.  Allergies: No Known Allergies  No medications prior to admission.    No results found for this or any previous visit (from the past 48 hour(s)).  No results found.   Pertinent items are noted in HPI.  Height 5\' 7"  (1.702 m), weight 90.7 kg, last menstrual period 07/18/2020, unknown if currently breastfeeding.  General appearance: alert, cooperative and appears stated age Head: Normocephalic, without obvious abnormality Neck: no JVD Resp: clear to auscultation bilaterally Cardio: regular rate and  rhythm, S1, S2 normal, no murmur, click, rub or gallop GI: soft, non-tender; bowel sounds normal; no masses,  no organomegaly Extremities: laceration middle and small fingers right hand Pulses: 2+ and symmetric Skin: Skin color, texture, turgor normal. No rashes or lesions Neurologic: Grossly normal Incision/Wound: sealed  Assessment/Plan Diagnosis laceration flexor tendons small finger questionable artery and nerve possible laceration flexor tendon to the middle finger.  Plan: we have discussed exploration repair her notes for from the hospital are reviewed. We will place her on Septra DS. We will schedule for next of her exploration repair arteries nerves tendons middle and small fingers dictated by findings in outpatient under regional anesthesia. Preperi-and postoperative course been discussed along with risk and complications. She is aware there is no guarantee to the surgery the possibility of infection recurrence complete mobility. The amount of rehabilitation is also discussed with her. This is scheduled as an outpatient under regional anesthesia.   07/20/2020 08/13/2020, 11:17 AM

## 2020-08-13 NOTE — Op Note (Signed)
I assisted Surgeon(s) and Role:    * Cindee Salt, MD - Primary    * Betha Loa, MD on the Procedure(s): EXPLORATION/REPAIR TENDON RIGHT SMALL FINGER, EXPLORE MIDDLE FINGER on 08/13/2020.  I provided assistance on this case as follows: retraction soft tissues, retrieval of tendon, closure wound.  Electronically signed by: Betha Loa, MD Date: 08/13/2020 Time: 2:18 PM

## 2020-08-13 NOTE — Brief Op Note (Signed)
08/13/2020  2:22 PM  PATIENT:  Darene Lamer  24 y.o. female  PRE-OPERATIVE DIAGNOSIS:  LACERATION RIGHT MIDDLE FINGER, LACERATION FLEXOR TENDONS RIGHT SMALL FINGER  POST-OPERATIVE DIAGNOSIS:  LACERATION RIGHT MIDDLE FINGER, LACERATION FLEXOR TENDONS RIGHT SMALL FINGER  PROCEDURE:  Procedure(s): EXPLORATION/REPAIR TENDON RIGHT SMALL FINGER, EXPLORE MIDDLE FINGER (Right)  SURGEON:  Surgeon(s) and Role:    * Cindee Salt, MD - Primary    * Betha Loa, MD  PHYSICIAN ASSISTANT:   ASSISTANTS: Karlyn Agee ,MD   ANESTHESIA:   regional and IV sedation  EBL: 53ml BLOOD ADMINISTERED:none  DRAINS: none   LOCAL MEDICATIONS USED:  NONE  SPECIMEN:  No Specimen  DISPOSITION OF SPECIMEN:  N/A  COUNTS:  YES  TOURNIQUET:   Total Tourniquet Time Documented: Upper Arm (Right) - 47 minutes Total: Upper Arm (Right) - 47 minutes   DICTATION: .Reubin Milan Dictation  PLAN OF CARE: Discharge to home after PACU  PATIENT DISPOSITION:  PACU - hemodynamically stable.

## 2020-08-13 NOTE — Anesthesia Procedure Notes (Signed)
Procedure Name: MAC Date/Time: 08/13/2020 1:22 PM Performed by: Signe Colt, CRNA Pre-anesthesia Checklist: Patient identified, Emergency Drugs available, Suction available, Patient being monitored and Timeout performed Patient Re-evaluated:Patient Re-evaluated prior to induction Oxygen Delivery Method: Simple face mask

## 2020-08-13 NOTE — Anesthesia Preprocedure Evaluation (Addendum)
Anesthesia Evaluation  Patient identified by MRN, date of birth, ID band Patient awake    Reviewed: Allergy & Precautions, NPO status , Patient's Chart, lab work & pertinent test results  Airway Mallampati: I  TM Distance: >3 FB Neck ROM: Full    Dental  (+) Chipped, Dental Advisory Given,    Pulmonary neg pulmonary ROS, Patient abstained from smoking.,    Pulmonary exam normal breath sounds clear to auscultation       Cardiovascular negative cardio ROS Normal cardiovascular exam Rhythm:Regular Rate:Normal     Neuro/Psych PSYCHIATRIC DISORDERS Anxiety Depression negative neurological ROS     GI/Hepatic negative GI ROS, Neg liver ROS,   Endo/Other  negative endocrine ROS  Renal/GU negative Renal ROS  negative genitourinary   Musculoskeletal negative musculoskeletal ROS (+)   Abdominal   Peds  Hematology negative hematology ROS (+)   Anesthesia Other Findings   Reproductive/Obstetrics                            Anesthesia Physical Anesthesia Plan  ASA: II  Anesthesia Plan: MAC and Regional   Post-op Pain Management:  Regional for Post-op pain   Induction: Intravenous  PONV Risk Score and Plan: 2 and Propofol infusion, Treatment may vary due to age or medical condition, Midazolam, Dexamethasone and Ondansetron  Airway Management Planned: Natural Airway  Additional Equipment:   Intra-op Plan:   Post-operative Plan:   Informed Consent: I have reviewed the patients History and Physical, chart, labs and discussed the procedure including the risks, benefits and alternatives for the proposed anesthesia with the patient or authorized representative who has indicated his/her understanding and acceptance.     Dental advisory given  Plan Discussed with: CRNA  Anesthesia Plan Comments:         Anesthesia Quick Evaluation

## 2020-08-13 NOTE — Transfer of Care (Signed)
Immediate Anesthesia Transfer of Care Note  Patient: Kim Allen  Procedure(s) Performed: EXPLORATION/REPAIR TENDON RIGHT SMALL FINGER, EXPLORE MIDDLE FINGER (Right Hand)  Patient Location: PACU  Anesthesia Type:MAC combined with regional for post-op pain  Level of Consciousness: awake, alert , oriented and patient cooperative  Airway & Oxygen Therapy: Patient Spontanous Breathing and Patient connected to face mask oxygen  Post-op Assessment: Report given to RN and Post -op Vital signs reviewed and stable  Post vital signs: Reviewed and stable  Last Vitals:  Vitals Value Taken Time  BP    Temp    Pulse 73 08/13/20 1421  Resp    SpO2 100 % 08/13/20 1421  Vitals shown include unvalidated device data.  Last Pain:  Vitals:   08/13/20 1151  TempSrc: Oral  PainSc: 7       Patients Stated Pain Goal: 5 (69/45/03 8882)  Complications: No complications documented.

## 2020-08-13 NOTE — Discharge Instructions (Addendum)
Received Tylenol earlier today. Next dose not due until 6pm tonight if needed.   Post Anesthesia Home Care Instructions  Activity: Get plenty of rest for the remainder of the day. A responsible individual must stay with you for 24 hours following the procedure.  For the next 24 hours, DO NOT: -Drive a car -Advertising copywriter -Drink alcoholic beverages -Take any medication unless instructed by your physician -Make any legal decisions or sign important papers.  Meals: Start with liquid foods such as gelatin or soup. Progress to regular foods as tolerated. Avoid greasy, spicy, heavy foods. If nausea and/or vomiting occur, drink only clear liquids until the nausea and/or vomiting subsides. Call your physician if vomiting continues.  Special Instructions/Symptoms: Your throat may feel dry or sore from the anesthesia or the breathing tube placed in your throat during surgery. If this causes discomfort, gargle with warm salt water. The discomfort should disappear within 24 hours.  If you had a scopolamine patch placed behind your ear for the management of post- operative nausea and/or vomiting:  1. The medication in the patch is effective for 72 hours, after which it should be removed.  Wrap patch in a tissue and discard in the trash. Wash hands thoroughly with soap and water. 2. You may remove the patch earlier than 72 hours if you experience unpleasant side effects which may include dry mouth, dizziness or visual disturbances. 3. Avoid touching the patch. Wash your hands with soap and water after contact with the patch.   [facility] Center Instructions Hand Surgery  Wound Care: Keep your hand elevated above the level of your heart.  Do not allow it to dangle by your side.  Keep the dressing dry and do not remove it unless your doctor advises you to do so.  He will usually change it at the time of your post-op visit.  Moving your fingers is advised to stimulate circulation but will depend on the  site of your surgery.  If you have a splint applied, your doctor will advise you regarding movement.  Activity: Do not drive or operate machinery today.  Rest today and then you may return to your normal activity and work as indicated by your physician.  Diet:  Drink liquids today or eat a light diet.  You may resume a regular diet tomorrow.    General expectations: Pain for two to three days. Fingers may become slightly swollen.  Call your doctor if any of the following occur: Severe pain not relieved by pain medication. Elevated temperature. Dressing soaked with blood. Inability to move fingers. White or bluish color to fingers.  Regional Anesthesia Blocks  1. Numbness or the inability to move the "blocked" extremity may last from 3-48 hours after placement. The length of time depends on the medication injected and your individual response to the medication. If the numbness is not going away after 48 hours, call your surgeon.  2. The extremity that is blocked will need to be protected until the numbness is gone and the  Strength has returned. Because you cannot feel it, you will need to take extra care to avoid injury. Because it may be weak, you may have difficulty moving it or using it. You may not know what position it is in without looking at it while the block is in effect.  3. For blocks in the legs and feet, returning to weight bearing and walking needs to be done carefully. You will need to wait until the numbness is entirely gone  and the strength has returned. You should be able to move your leg and foot normally before you try and bear weight or walk. You will need someone to be with you when you first try to ensure you do not fall and possibly risk injury.  4. Bruising and tenderness at the needle site are common side effects and will resolve in a few days.  5. Persistent numbness or new problems with movement should be communicated to the surgeon or the [facility] [phone] [facility] [phone]).

## 2020-08-13 NOTE — Progress Notes (Signed)
Assisted Dr. Woodrum with right, ultrasound guided, supraclavicular block. Side rails up, monitors on throughout procedure. See vital signs in flow sheet. Tolerated Procedure well. 

## 2020-08-14 ENCOUNTER — Encounter (HOSPITAL_BASED_OUTPATIENT_CLINIC_OR_DEPARTMENT_OTHER): Payer: Self-pay | Admitting: Orthopedic Surgery

## 2020-08-15 ENCOUNTER — Encounter (HOSPITAL_BASED_OUTPATIENT_CLINIC_OR_DEPARTMENT_OTHER): Payer: Self-pay | Admitting: Orthopedic Surgery

## 2020-08-15 MED ORDER — ROPIVACAINE HCL 7.5 MG/ML IJ SOLN
INTRAMUSCULAR | Status: DC | PRN
  Administered 2020-08-13: 20 mL via PERINEURAL

## 2020-08-15 MED ORDER — DEXAMETHASONE SODIUM PHOSPHATE 10 MG/ML IJ SOLN
INTRAMUSCULAR | Status: DC | PRN
  Administered 2020-08-13: 5 mg

## 2020-08-15 NOTE — Anesthesia Procedure Notes (Signed)
Anesthesia Regional Block: Supraclavicular block   Pre-Anesthetic Checklist: ,, timeout performed, Correct Patient, Correct Site, Correct Laterality, Correct Procedure, Correct Position, site marked, Risks and benefits discussed,  Surgical consent,  Pre-op evaluation,  At surgeon's request and post-op pain management  Laterality: Right  Prep: Maximum Sterile Barrier Precautions used, chloraprep       Needles:  Injection technique: Single-shot  Needle Type: Echogenic Stimulator Needle     Needle Length: 4cm  Needle Gauge: 22     Additional Needles:   Procedures:,,,, ultrasound used (permanent image in chart),,,,  Narrative:  Start time: 08/13/2020 12:35 PM End time: 08/13/2020 12:45 PM Injection made incrementally with aspirations every 5 mL.  Performed by: Personally  Anesthesiologist: Elmer Picker, MD  Additional Notes: Monitors applied. No increased pain on injection. No increased resistance to injection. Injection made in 5cc increments. Good needle visualization. Patient tolerated procedure well.

## 2020-08-15 NOTE — Anesthesia Postprocedure Evaluation (Signed)
Anesthesia Post Note  Patient: Oceania Noori  Procedure(s) Performed: EXPLORATION/REPAIR TENDON RIGHT SMALL FINGER, EXPLORE MIDDLE FINGER (Right Hand)     Patient location during evaluation: PACU Anesthesia Type: Regional and MAC Level of consciousness: awake and alert Pain management: pain level controlled Vital Signs Assessment: post-procedure vital signs reviewed and stable Respiratory status: spontaneous breathing, nonlabored ventilation, respiratory function stable and patient connected to nasal cannula oxygen Cardiovascular status: stable and blood pressure returned to baseline Postop Assessment: no apparent nausea or vomiting Anesthetic complications: no   No complications documented.  Last Vitals:  Vitals:   08/13/20 1430 08/13/20 1507  BP: 104/75 100/77  Pulse: (!) 59 71  Resp: 16 16  Temp:  37.1 C  SpO2: 100% 100%    Last Pain:  Vitals:   08/14/20 0903  TempSrc:   PainSc: 8                  Navaeh Kehres L Giavonni Cizek

## 2020-08-18 LAB — AEROBIC/ANAEROBIC CULTURE W GRAM STAIN (SURGICAL/DEEP WOUND)
Culture: NORMAL
Gram Stain: NONE SEEN

## 2021-04-14 ENCOUNTER — Other Ambulatory Visit: Payer: Self-pay

## 2021-04-14 ENCOUNTER — Encounter (HOSPITAL_COMMUNITY): Payer: Self-pay

## 2021-04-14 ENCOUNTER — Ambulatory Visit (HOSPITAL_COMMUNITY)
Admission: EM | Admit: 2021-04-14 | Discharge: 2021-04-14 | Disposition: A | Discharge status: Home / Self Care | Payer: BC Managed Care – PPO | Attending: Emergency Medicine | Admitting: Emergency Medicine

## 2021-04-14 DIAGNOSIS — Z113 Encounter for screening for infections with a predominantly sexual mode of transmission: Secondary | ICD-10-CM | POA: Insufficient documentation

## 2021-04-14 DIAGNOSIS — L739 Follicular disorder, unspecified: Secondary | ICD-10-CM | POA: Insufficient documentation

## 2021-04-14 MED ORDER — HYDROCORTISONE 1 % EX CREA: TOPICAL_CREAM | CUTANEOUS | 0 refills | Status: AC

## 2021-04-14 MED ORDER — DOXYCYCLINE HYCLATE 100 MG PO CAPS: 100.0000 mg | ORAL_CAPSULE | Freq: Two times a day (BID) | ORAL | 0 refills | Status: AC

## 2021-04-14 NOTE — ED Triage Notes (Signed)
Pt reports painful sores and blisters around her vagina x 2 weeks.

## 2021-04-14 NOTE — ED Provider Notes (Signed)
[facility]    CSN: [id number] Arrival date & time: 04/14/21  1412      History   Chief Complaint Chief Complaint  Patient presents with  . Sore    HPI Kim Allen is a 25 y.o. female.   Patient presents with painful sores and blisters over pelvic area and labia for approximately 2 weeks.  Was told that area over the pelvic region was ingrown hairs.  2 days ago blister appeared over left labia.  Painful and tender.  Labia area irritated causing burning with urination. was tested at the local health department for gonorrhea chlamydia trichomonas and herpes.  Lab results pending. Recently treated for bacterial vaginosis.  Completed all medication.  Has not had sex since prior to STI screening.  2 partners.  No condom use.  Has not attempted treatment.  Denies discharge, frequency, urgency, abdominal pain, flank pain.  Past Medical History:  Diagnosis Date  . Anxiety   . Depression   . PTSD (post-traumatic stress disorder)     Patient Active Problem List   Diagnosis Date Noted  . LGSIL on Pap smear of cervix 08/02/2018  . Severe major depression without psychotic features (HCC) 05/22/2017  . Cannabis use disorder, severe, dependence (HCC) 05/22/2017    Past Surgical History:  Procedure Laterality Date  . HERNIA REPAIR  2001  . TENDON REPAIR Right 08/13/2020   Procedure: EXPLORATION/REPAIR TENDON RIGHT SMALL FINGER, EXPLORE MIDDLE FINGER;  Surgeon: Cindee Salt, MD;  Location: [facility];  Service: Orthopedics;  Laterality: Right;    OB History    Gravida  1   Para  1   Term  1   Preterm  0   AB  0   Living  1     SAB  0   IAB  0   Ectopic  0   Multiple  0   Live Births  1            Home Medications    Prior to Admission medications   Medication Sig Start Date End Date Taking? Authorizing Provider  doxycycline (VIBRAMYCIN) 100 MG capsule Take 1 capsule (100 mg total) by mouth 2 (two) times daily. 04/14/21  Yes Zakariye Nee,  Elita Boone, NP  etonogestrel (NEXPLANON) 68 MG IMPL implant 1 each by Subdermal route once.   Yes [provider]  hydrocortisone cream 1 % Apply to affected area 2 times daily 04/14/21  Yes Ahmod Gillespie R, NP  FLUoxetine (PROZAC) 10 MG capsule Take 1 capsule (10 mg total) by mouth daily. Patient taking differently: Take 20 mg by mouth daily. 07/04/18   Armando Reichert, [name]  ibuprofen (ADVIL,MOTRIN) 600 MG tablet Take 1 tablet (600 mg total) by mouth every 6 (six) hours. 06/10/18   [name], MD  traMADol (ULTRAM) 50 MG tablet Take 1 tablet (50 mg total) by mouth every 6 (six) hours as needed. 08/13/20   Cindee Salt, MD    Family History Family History  Problem Relation Age of Onset  . Hypertension Mother   . Stroke Mother   . Hypertension Father   . Diabetes Maternal Aunt   . Diabetes Paternal Aunt   . Diabetes Paternal Uncle     Social History Social History   Tobacco Use  . Smoking status: Never Smoker  . Smokeless tobacco: Never Used  Vaping Use  . Vaping Use: Every day  Substance Use Topics  . Alcohol use: Yes    Comment: socially  .  Drug use: Yes    Types: Marijuana    Comment: daily     Allergies   Patient has no known allergies.   Review of Systems Review of Systems  Defer to HPI   Physical Exam Triage Vital Signs ED Triage Vitals  Enc Vitals Group     BP 04/14/21 1530 125/74     Pulse Rate 04/14/21 1530 73     Resp 04/14/21 1530 16     Temp 04/14/21 1530 97.8 F (36.6 C)     Temp Source 04/14/21 1530 Oral     SpO2 04/14/21 1530 100 %     Weight --      Height --      Head Circumference --      Peak Flow --      Pain Score 04/14/21 1528 10     Pain Loc --      Pain Edu? --      Excl. in GC? --    No data found.  Updated Vital Signs BP 125/74 (BP Location: Right Arm)   Pulse 73   Temp 97.8 F (36.6 C) (Oral)   Resp 16   LMP  (Within Weeks) Comment: 2-3 weeks  SpO2 100%   Breastfeeding No   Visual Acuity Right Eye  Distance:   Left Eye Distance:   Bilateral Distance:    Right Eye Near:   Left Eye Near:    Bilateral Near:     Physical Exam Constitutional:      Appearance: Normal appearance. She is normal weight.  HENT:     Head: Normocephalic.  Pulmonary:     Effort: Pulmonary effort is normal.  Genitourinary:      Comments: folliculitis presents over entire pubic area, with small pus filled papules on left lower pubic area with papular irritation noted,  One small blister present on left upper labia Musculoskeletal:        General: Normal range of motion.  Skin:    General: Skin is warm and dry.  Neurological:     Mental Status: She is alert and oriented to person, place, and time. Mental status is at baseline.  Psychiatric:        Mood and Affect: Mood normal.        Behavior: Behavior normal.        Thought Content: Thought content normal.        Judgment: Judgment normal.      UC Treatments / Results  Labs (all labs ordered are listed, but only abnormal results are displayed) Labs Reviewed  HSV CULTURE AND TYPING    EKG   Radiology No results found.  Procedures Procedures (including critical care time)  Medications Ordered in UC Medications - No data to display  Initial Impression / Assessment and Plan / UC Course  I have reviewed the triage vital signs and the nursing notes.  Pertinent labs & imaging results that were available during my care of the patient were reviewed by me and considered in my medical decision making (see chart for details).  Folliculitis  Routine screening for sit  1. Doxycycline 100 mg bid for 7 days 2. Hydrocortisone cream for external use bid prn for vaginal irritation  3. Warm compresses to pubic hair, change all razors, allow area to heal before hair removal  4. Herpes culture obtained, pending 2-3 days, will notify if positive, advised abstinence until labs result  Final Clinical Impressions(s) / UC Diagnoses   Final diagnoses:  None     Discharge Instructions     Can use cream as needed twice daily  Take antibiotic twice a day for 7 days  Labs pending 2-3 days, will be called if positive for treatment    ED Prescriptions    Medication Sig Dispense Auth. Provider   doxycycline (VIBRAMYCIN) 100 MG capsule Take 1 capsule (100 mg total) by mouth 2 (two) times daily. 14 capsule Hanny Elsberry R, NP   hydrocortisone cream 1 % Apply to affected area 2 times daily 15 g Jakobi Thetford, Elita Boone, NP     PDMP not reviewed this encounter.   Valinda Hoar, NP 04/14/21 1654

## 2021-04-14 NOTE — Discharge Instructions (Addendum)
Can use cream as needed twice daily  Take antibiotic twice a day for 7 days  Labs pending 2-3 days, will be called if positive for treatment

## 2021-04-17 LAB — HSV CULTURE AND TYPING
